# Patient Record
Sex: Female | Born: 1980 | Race: White | Hispanic: No | Smoking: Never smoker
Health system: Southern US, Community
[De-identification: ages and names within clinical notes are randomized; demographics above are authoritative.]

## PROBLEM LIST (undated history)

## (undated) DIAGNOSIS — G5600 Carpal tunnel syndrome, unspecified upper limb: Secondary | ICD-10-CM

## (undated) DIAGNOSIS — T7840XA Allergy, unspecified, initial encounter: Secondary | ICD-10-CM

## (undated) DIAGNOSIS — F419 Anxiety disorder, unspecified: Secondary | ICD-10-CM

## (undated) DIAGNOSIS — M1990 Unspecified osteoarthritis, unspecified site: Secondary | ICD-10-CM

## (undated) DIAGNOSIS — J189 Pneumonia, unspecified organism: Secondary | ICD-10-CM

## (undated) DIAGNOSIS — I10 Essential (primary) hypertension: Secondary | ICD-10-CM

## (undated) DIAGNOSIS — F329 Major depressive disorder, single episode, unspecified: Secondary | ICD-10-CM

## (undated) DIAGNOSIS — D649 Anemia, unspecified: Secondary | ICD-10-CM

## (undated) DIAGNOSIS — F32A Depression, unspecified: Secondary | ICD-10-CM

## (undated) DIAGNOSIS — M797 Fibromyalgia: Secondary | ICD-10-CM

## (undated) DIAGNOSIS — R519 Headache, unspecified: Secondary | ICD-10-CM

## (undated) DIAGNOSIS — T8859XA Other complications of anesthesia, initial encounter: Secondary | ICD-10-CM

## (undated) DIAGNOSIS — E039 Hypothyroidism, unspecified: Secondary | ICD-10-CM

## (undated) DIAGNOSIS — G8929 Other chronic pain: Secondary | ICD-10-CM

## (undated) HISTORY — DX: Depression, unspecified: F32.A

## (undated) HISTORY — DX: Carpal tunnel syndrome, unspecified upper limb: G56.00

## (undated) HISTORY — DX: Major depressive disorder, single episode, unspecified: F32.9

## (undated) HISTORY — DX: Allergy, unspecified, initial encounter: T78.40XA

## (undated) HISTORY — PX: CARPAL TUNNEL RELEASE: SHX101

## (undated) HISTORY — DX: Anxiety disorder, unspecified: F41.9

## (undated) HISTORY — DX: Essential (primary) hypertension: I10

---
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
## 2000-06-12 ENCOUNTER — Other Ambulatory Visit: Admission: RE | Admit: 2000-06-12 | Discharge: 2000-06-12 | Payer: Self-pay | Admitting: Obstetrics

## 2000-06-21 ENCOUNTER — Observation Stay (HOSPITAL_COMMUNITY): Admission: AD | Admit: 2000-06-21 | Discharge: 2000-06-21 | Payer: Self-pay | Admitting: Obstetrics

## 2000-09-11 ENCOUNTER — Inpatient Hospital Stay (HOSPITAL_COMMUNITY): Admission: AD | Admit: 2000-09-11 | Discharge: 2000-09-11 | Payer: Self-pay | Admitting: Obstetrics

## 2000-10-21 ENCOUNTER — Inpatient Hospital Stay (HOSPITAL_COMMUNITY): Admission: AD | Admit: 2000-10-21 | Discharge: 2000-10-21 | Payer: Self-pay | Admitting: Obstetrics

## 2000-10-27 ENCOUNTER — Inpatient Hospital Stay (HOSPITAL_COMMUNITY): Admission: AD | Admit: 2000-10-27 | Discharge: 2000-10-27 | Payer: Self-pay | Admitting: Obstetrics

## 2000-11-02 ENCOUNTER — Inpatient Hospital Stay (HOSPITAL_COMMUNITY): Admission: AD | Admit: 2000-11-02 | Discharge: 2000-11-02 | Payer: Self-pay | Admitting: Obstetrics

## 2000-12-29 ENCOUNTER — Inpatient Hospital Stay (HOSPITAL_COMMUNITY): Admission: AD | Admit: 2000-12-29 | Discharge: 2000-12-29 | Payer: Self-pay | Admitting: Obstetrics

## 2001-01-23 ENCOUNTER — Inpatient Hospital Stay (HOSPITAL_COMMUNITY): Admission: AD | Admit: 2001-01-23 | Discharge: 2001-01-23 | Payer: Self-pay | Admitting: Obstetrics

## 2001-01-30 ENCOUNTER — Inpatient Hospital Stay (HOSPITAL_COMMUNITY): Admission: AD | Admit: 2001-01-30 | Discharge: 2001-02-03 | Payer: Self-pay | Admitting: Obstetrics

## 2001-01-30 ENCOUNTER — Encounter: Payer: Self-pay | Admitting: Obstetrics

## 2001-01-30 ENCOUNTER — Encounter (INDEPENDENT_AMBULATORY_CARE_PROVIDER_SITE_OTHER): Payer: Self-pay

## 2001-02-04 ENCOUNTER — Encounter: Admission: RE | Admit: 2001-02-04 | Discharge: 2001-03-06 | Payer: Self-pay | Admitting: Obstetrics

## 2001-02-14 ENCOUNTER — Inpatient Hospital Stay (HOSPITAL_COMMUNITY): Admission: AD | Admit: 2001-02-14 | Discharge: 2001-02-14 | Payer: Self-pay | Admitting: Anesthesiology

## 2002-01-12 ENCOUNTER — Encounter: Admission: RE | Admit: 2002-01-12 | Discharge: 2002-01-12 | Payer: Self-pay | Admitting: *Deleted

## 2002-04-10 ENCOUNTER — Encounter: Admission: RE | Admit: 2002-04-10 | Discharge: 2002-04-10 | Payer: Self-pay | Admitting: *Deleted

## 2007-04-18 ENCOUNTER — Emergency Department (HOSPITAL_COMMUNITY): Admission: EM | Admit: 2007-04-18 | Discharge: 2007-04-18 | Payer: Self-pay | Admitting: Emergency Medicine

## 2007-07-01 ENCOUNTER — Emergency Department (HOSPITAL_COMMUNITY): Admission: EM | Admit: 2007-07-01 | Discharge: 2007-07-01 | Payer: Self-pay | Admitting: Emergency Medicine

## 2007-12-21 ENCOUNTER — Emergency Department (HOSPITAL_COMMUNITY): Admission: EM | Admit: 2007-12-21 | Discharge: 2007-12-22 | Payer: Self-pay | Admitting: Emergency Medicine

## 2008-07-26 ENCOUNTER — Emergency Department (HOSPITAL_COMMUNITY): Admission: EM | Admit: 2008-07-26 | Discharge: 2008-07-26 | Payer: Self-pay | Admitting: Emergency Medicine

## 2009-03-04 ENCOUNTER — Emergency Department (HOSPITAL_COMMUNITY): Admission: EM | Admit: 2009-03-04 | Discharge: 2009-03-05 | Payer: Self-pay | Admitting: Emergency Medicine

## 2009-03-04 ENCOUNTER — Encounter (INDEPENDENT_AMBULATORY_CARE_PROVIDER_SITE_OTHER): Payer: Self-pay | Admitting: *Deleted

## 2009-04-18 ENCOUNTER — Emergency Department (HOSPITAL_COMMUNITY): Admission: EM | Admit: 2009-04-18 | Discharge: 2009-04-18 | Payer: Self-pay | Admitting: Emergency Medicine

## 2009-04-26 DIAGNOSIS — F319 Bipolar disorder, unspecified: Secondary | ICD-10-CM | POA: Insufficient documentation

## 2009-04-26 DIAGNOSIS — T7840XA Allergy, unspecified, initial encounter: Secondary | ICD-10-CM | POA: Insufficient documentation

## 2009-04-26 DIAGNOSIS — K219 Gastro-esophageal reflux disease without esophagitis: Secondary | ICD-10-CM | POA: Insufficient documentation

## 2009-04-27 ENCOUNTER — Ambulatory Visit: Payer: Self-pay | Admitting: Gastroenterology

## 2009-04-27 DIAGNOSIS — R112 Nausea with vomiting, unspecified: Secondary | ICD-10-CM | POA: Insufficient documentation

## 2009-04-27 DIAGNOSIS — M549 Dorsalgia, unspecified: Secondary | ICD-10-CM | POA: Insufficient documentation

## 2009-04-27 DIAGNOSIS — Z87448 Personal history of other diseases of urinary system: Secondary | ICD-10-CM | POA: Insufficient documentation

## 2009-04-27 DIAGNOSIS — F329 Major depressive disorder, single episode, unspecified: Secondary | ICD-10-CM | POA: Insufficient documentation

## 2009-04-27 DIAGNOSIS — R195 Other fecal abnormalities: Secondary | ICD-10-CM | POA: Insufficient documentation

## 2009-04-27 DIAGNOSIS — R1011 Right upper quadrant pain: Secondary | ICD-10-CM | POA: Insufficient documentation

## 2009-04-27 DIAGNOSIS — C539 Malignant neoplasm of cervix uteri, unspecified: Secondary | ICD-10-CM | POA: Insufficient documentation

## 2009-04-27 DIAGNOSIS — F411 Generalized anxiety disorder: Secondary | ICD-10-CM | POA: Insufficient documentation

## 2009-04-27 LAB — CONVERTED CEMR LAB
Ferritin: 13.4 ng/mL (ref 10.0–291.0)
Hepatitis B Surface Ag: NEGATIVE
Sed Rate: 5 mm/hr (ref 0–22)
Vitamin B-12: 382 pg/mL (ref 211–911)

## 2009-04-28 ENCOUNTER — Telehealth: Payer: Self-pay | Admitting: Gastroenterology

## 2009-05-03 ENCOUNTER — Telehealth (INDEPENDENT_AMBULATORY_CARE_PROVIDER_SITE_OTHER): Payer: Self-pay | Admitting: *Deleted

## 2009-05-03 ENCOUNTER — Ambulatory Visit: Payer: Self-pay | Admitting: Gastroenterology

## 2009-05-03 ENCOUNTER — Encounter: Payer: Self-pay | Admitting: Gastroenterology

## 2009-05-03 DIAGNOSIS — K2970 Gastritis, unspecified, without bleeding: Secondary | ICD-10-CM | POA: Insufficient documentation

## 2009-05-03 DIAGNOSIS — K2990 Gastroduodenitis, unspecified, without bleeding: Secondary | ICD-10-CM

## 2009-05-05 ENCOUNTER — Encounter: Payer: Self-pay | Admitting: Gastroenterology

## 2010-08-05 DIAGNOSIS — J189 Pneumonia, unspecified organism: Secondary | ICD-10-CM

## 2010-08-05 HISTORY — DX: Pneumonia, unspecified organism: J18.9

## 2010-11-09 LAB — CBC
Platelets: 283 10*3/uL (ref 150–400)
RBC: 4.27 MIL/uL (ref 3.87–5.11)
WBC: 8.1 10*3/uL (ref 4.0–10.5)

## 2010-11-09 LAB — URINALYSIS, ROUTINE W REFLEX MICROSCOPIC
Bilirubin Urine: NEGATIVE
Glucose, UA: NEGATIVE mg/dL
Hgb urine dipstick: NEGATIVE
Specific Gravity, Urine: 1.023 (ref 1.005–1.030)
Urobilinogen, UA: 0.2 mg/dL (ref 0.0–1.0)

## 2010-11-09 LAB — LIPASE, BLOOD: Lipase: 22 U/L (ref 11–59)

## 2010-11-09 LAB — DIFFERENTIAL
Eosinophils Absolute: 0.3 10*3/uL (ref 0.0–0.7)
Eosinophils Relative: 4 % (ref 0–5)
Lymphs Abs: 2.3 10*3/uL (ref 0.7–4.0)
Monocytes Absolute: 0.4 10*3/uL (ref 0.1–1.0)

## 2010-11-09 LAB — COMPREHENSIVE METABOLIC PANEL
ALT: 18 U/L (ref 0–35)
AST: 22 U/L (ref 0–37)
Albumin: 4 g/dL (ref 3.5–5.2)
CO2: 24 mEq/L (ref 19–32)
Calcium: 8.9 mg/dL (ref 8.4–10.5)
Chloride: 107 mEq/L (ref 96–112)
GFR calc Af Amer: >60 mL/min (ref >60)
GFR calc non Af Amer: >60 mL/min (ref >60)
Sodium: 138 mEq/L (ref 135–145)
Total Bilirubin: 0.6 mg/dL (ref 0.3–1.2)

## 2010-11-11 LAB — URINE MICROSCOPIC-ADD ON

## 2010-11-11 LAB — URINALYSIS, ROUTINE W REFLEX MICROSCOPIC
Glucose, UA: NEGATIVE mg/dL
Hgb urine dipstick: NEGATIVE
Protein, ur: NEGATIVE mg/dL
Specific Gravity, Urine: 1.034 — ABNORMAL HIGH (ref 1.005–1.030)
pH: 6.5 (ref 5.0–8.0)

## 2010-11-11 LAB — COMPREHENSIVE METABOLIC PANEL
ALT: 13 U/L (ref 0–35)
Albumin: 3.8 g/dL (ref 3.5–5.2)
Alkaline Phosphatase: 56 U/L (ref 39–117)
Calcium: 8.8 mg/dL (ref 8.4–10.5)
GFR calc Af Amer: >60 mL/min (ref >60)
Glucose, Bld: 92 mg/dL (ref 70–99)
Potassium: 3.6 mEq/L (ref 3.5–5.1)
Sodium: 138 mEq/L (ref 135–145)
Total Protein: 6.4 g/dL (ref 6.0–8.3)

## 2010-11-11 LAB — CBC
MCHC: 33.2 g/dL (ref 30.0–36.0)
Platelets: 272 10*3/uL (ref 150–400)
RDW: 15.3 % (ref 11.5–15.5)

## 2010-11-11 LAB — DIFFERENTIAL
Basophils Relative: 1 % (ref 0–1)
Eosinophils Absolute: 0.5 10*3/uL (ref 0.0–0.7)
Lymphs Abs: 1.8 10*3/uL (ref 0.7–4.0)
Monocytes Absolute: 0.6 10*3/uL (ref 0.1–1.0)
Monocytes Relative: 6 % (ref 3–12)
Neutro Abs: 6.7 10*3/uL (ref 1.7–7.7)
Neutrophils Relative %: 69 % (ref 43–77)

## 2010-11-11 LAB — POCT PREGNANCY, URINE: Preg Test, Ur: NEGATIVE

## 2010-12-21 NOTE — Discharge Summary (Signed)
[facility]  Patient:    Kathy Hickman, Kathy Hickman                     MRN: [id number] Adm. Date:  [id number] Attending:  Venita Sheffield                           Discharge Summary  HISTORY:                      The patient is a 30 year old primigravida with an [facility] of January 25, 2001 who was brought in for induction at the patients request. Her cervix was closed. The presenting part was floating. She had a negative group B strep. Ultrasound was performed for presentation and it was a vertex and oligohydramnios was noted.  HOSPITAL COURSE:              Cervidil was inserted and the patient was started on Pitocin stimulation. At the time she was started the vertex was at -3 station, membranes were artificially ruptured, fluid was meconium stained. IUPC and scalp lead was started and she had an amnioinfusion. After being in active labor for greater than eight hours, it was decided she would be delivered by C-section because of failure to progress in labor. She delivered a female, Apgars 7 and 9 from the OP position weighing 7 pounds 3 ounces. Postoperatively, she did well. Her hemoglobin was 9.9. She was discharged home on the third postoperative day, ambulatory on a regular diet.  DISCHARGE MEDICATIONS:        1. Tylox one q.4h. p.r.n.                               2. Ferrous sulfate 325 mg p.o. q.d.                               3. Micronor one p.o. daily.  DISCHARGE FOLLOWUP:           The patient is to see me in six weeks.  DISCHARGE DIAGNOSIS:          Status post primary low transverse cesarean section at term with cephalopelvic disproportion. DD:  02/03/01 TD:  02/03/01 Job: [id number]

## 2010-12-21 NOTE — Op Note (Signed)
[facility]  Patient:    Kathy Hickman, Kathy Hickman                       MRN: [id number] Proc. Date: 01/31/01 Attending:  Kathreen Cosier, M.D.                           Operative Report  PREOPERATIVE DIAGNOSES:       1. Failure to progress.                               2. Cephalopelvic disproportion.  SURGEON:                      Kathreen Cosier, M.D.  ANESTHESIA:                   Epidural.  DESCRIPTION OF PROCEDURE:     The patient was placed on the operating room table in the supine position.  The abdomen was prepped and draped.  The bladder was emptied with a Foley catheter.  A transverse suprapubic incision was made and carried down to the rectus fascia.  The fascia was cleanly incised for the length of the incision.  The rectus muscles were retracted laterally.  The peritoneum was incised longitudinally.  A transverse incision was made in the visceral peritoneum above the bladder and the bladder mobilized inferiorly.  A transverse lower uterine incision was made.  The patient was delivered from the OP position of a female with Apgars of 7 and 9 weighing 7 lb 3 oz.  The placenta was anterior, small, removed manually and sent to pathology.  The uterine cavity was cleaned with dry laps.  The uterine incision was closed with continuous suture of #1 chromic.  Hemostasis was satisfactory.  The bladder flap was reattached with 2-0 chromic.  The uterus was well contracted.  The tubes and ovaries were normal.  The abdomen was closed in layers, the peritoneum with continuous suture of 0 chromic, the fascia with continuous suture of 0 Dexon, and the skin closed with subcuticular stitch of 3-0 plain.  Blood loss was 600 cc.  The patient tolerated the procedure well and was taken to the recovery room in good condition. DD:  01/31/01 TD:  01/31/01 Job: [id number]

## 2010-12-21 NOTE — H&P (Signed)
[facility]  Patient:    Kathy Hickman, Kathy Hickman                       MRN: [id number] Adm. Date:  01/30/01 Attending:  Kathreen Cosier, M.D.                         History and Physical  HISTORY:                      The patient is a 30 year old primigravida with an [facility] of January 25, 2001.  She was admitted for induction at the patients request.  The cervix was closed.  The vertex was floating.  She had a negative group B Strep.  On admission, ultrasound was performed which showed oligohydramnios.  The infant was ______ vertex presentation.  Cervidil was inserted and she was started on Pitocin at approximately 5:00 p.m. in the afternoon.  She had been contracting on her own.  The cervix was 3 cm with the vertex -3.  Membranes were artificially ruptured.  The fluid was meconium stained and there was a small amount of fluid.  IUPC and scalp ______ were planted.  Amnio infusion was begun.  The patient was also on Pitocin stimulation.  She progressed to 3-4 cm, 90%, with the vertex at -3, good labor and remained unchanged.  It was decided that she would deliver by C-section because of failure to progress.  PHYSICAL EXAMINATION:  HEENT:                        Negative.  LUNGS:                        Clear.  HEART:                        Regular rhythm.  No murmurs or gallops.  ABDOMEN:                      Term-sized uterus.  Estimated fetal weight was 7 lb.  EXTREMITIES:                  Negative. DD:  01/31/01 TD:  01/31/01 Job: [id number]

## 2011-03-17 ENCOUNTER — Emergency Department (HOSPITAL_COMMUNITY)
Admission: EM | Admit: 2011-03-17 | Discharge: 2011-03-17 | Disposition: A | Discharge status: Home / Self Care | Payer: BC Managed Care – PPO | Attending: Emergency Medicine | Admitting: Emergency Medicine

## 2011-03-17 DIAGNOSIS — R5381 Other malaise: Secondary | ICD-10-CM | POA: Insufficient documentation

## 2011-03-17 DIAGNOSIS — F319 Bipolar disorder, unspecified: Secondary | ICD-10-CM | POA: Insufficient documentation

## 2011-03-17 DIAGNOSIS — R42 Dizziness and giddiness: Secondary | ICD-10-CM | POA: Insufficient documentation

## 2011-03-17 DIAGNOSIS — Z79899 Other long term (current) drug therapy: Secondary | ICD-10-CM | POA: Insufficient documentation

## 2011-03-17 LAB — COMPREHENSIVE METABOLIC PANEL
ALT: 16 U/L (ref 0–35)
AST: 15 U/L (ref 0–37)
Albumin: 3.9 g/dL (ref 3.5–5.2)
Calcium: 9.6 mg/dL (ref 8.4–10.5)
GFR calc Af Amer: >60 mL/min (ref >60)
Glucose, Bld: 101 mg/dL — ABNORMAL HIGH (ref 70–99)
Sodium: 136 mEq/L (ref 135–145)
Total Protein: 7.2 g/dL (ref 6.0–8.3)

## 2011-03-17 LAB — RAPID URINE DRUG SCREEN, HOSP PERFORMED
Amphetamines: NOT DETECTED
Barbiturates: NOT DETECTED
Benzodiazepines: POSITIVE — AB
Tetrahydrocannabinol: NOT DETECTED

## 2011-03-17 LAB — CBC
Hemoglobin: 12.2 g/dL (ref 12.0–15.0)
MCH: 28.2 pg (ref 26.0–34.0)
MCHC: 33.2 g/dL (ref 30.0–36.0)
MCV: 84.8 fL (ref 78.0–100.0)
RBC: 4.33 MIL/uL (ref 3.87–5.11)

## 2011-03-17 LAB — URINALYSIS, ROUTINE W REFLEX MICROSCOPIC
Bilirubin Urine: NEGATIVE
Glucose, UA: NEGATIVE mg/dL
Hgb urine dipstick: NEGATIVE
Ketones, ur: NEGATIVE mg/dL
Protein, ur: NEGATIVE mg/dL
pH: 6.5 (ref 5.0–8.0)

## 2011-03-17 LAB — DIFFERENTIAL
Lymphs Abs: 1.9 10*3/uL (ref 0.7–4.0)
Monocytes Absolute: 0.5 10*3/uL (ref 0.1–1.0)
Monocytes Relative: 7 % (ref 3–12)
Neutro Abs: 4.6 10*3/uL (ref 1.7–7.7)
Neutrophils Relative %: 63 % (ref 43–77)

## 2011-05-10 LAB — BASIC METABOLIC PANEL
CO2: 24 mEq/L (ref 19–32)
Calcium: 8.8 mg/dL (ref 8.4–10.5)
Creatinine, Ser: 0.86 mg/dL (ref 0.4–1.2)
GFR calc Af Amer: >60 mL/min (ref >60)

## 2011-05-10 LAB — URINE MICROSCOPIC-ADD ON

## 2011-05-10 LAB — URINALYSIS, ROUTINE W REFLEX MICROSCOPIC
Bilirubin Urine: NEGATIVE
Glucose, UA: NEGATIVE mg/dL
Nitrite: NEGATIVE
Specific Gravity, Urine: 1.02 (ref 1.005–1.030)
pH: 5.5 (ref 5.0–8.0)

## 2011-05-10 LAB — CBC
MCHC: 33 g/dL (ref 30.0–36.0)
RBC: 4.56 MIL/uL (ref 3.87–5.11)
WBC: 12.9 10*3/uL — ABNORMAL HIGH (ref 4.0–10.5)

## 2011-05-10 LAB — POCT PREGNANCY, URINE: Preg Test, Ur: NEGATIVE

## 2011-05-14 LAB — CBC
Hemoglobin: 12.8
MCHC: 34.2
RBC: 4.53

## 2011-05-14 LAB — BASIC METABOLIC PANEL
CO2: 26
Calcium: 9.2
GFR calc Af Amer: >60
GFR calc non Af Amer: >60
Sodium: 140

## 2011-05-14 LAB — DIFFERENTIAL
Lymphocytes Relative: 16
Lymphs Abs: 1.1
Monocytes Absolute: 0.4
Monocytes Relative: 6
Neutro Abs: 5.5

## 2011-05-14 LAB — URINALYSIS, ROUTINE W REFLEX MICROSCOPIC
Nitrite: NEGATIVE
Specific Gravity, Urine: 1.009
pH: 6.5

## 2011-05-14 LAB — GC/CHLAMYDIA PROBE AMP, GENITAL
Chlamydia, DNA Probe: NEGATIVE
GC Probe Amp, Genital: NEGATIVE

## 2011-05-14 LAB — WET PREP, GENITAL
Clue Cells Wet Prep HPF POC: NONE SEEN
Trich, Wet Prep: NONE SEEN
Yeast Wet Prep HPF POC: NONE SEEN

## 2011-05-14 LAB — PREGNANCY, URINE: Preg Test, Ur: POSITIVE

## 2011-05-17 LAB — URINE CULTURE
Colony Count: NO GROWTH
Culture: NO GROWTH

## 2011-05-17 LAB — URINALYSIS, ROUTINE W REFLEX MICROSCOPIC
Bilirubin Urine: NEGATIVE
Glucose, UA: NEGATIVE
Ketones, ur: NEGATIVE
Specific Gravity, Urine: 1.022
pH: 6.5

## 2011-05-17 LAB — URINE MICROSCOPIC-ADD ON

## 2011-07-14 ENCOUNTER — Encounter: Payer: Self-pay | Admitting: *Deleted

## 2011-07-14 ENCOUNTER — Inpatient Hospital Stay (HOSPITAL_COMMUNITY)
Admission: EM | Admit: 2011-07-14 | Discharge: 2011-07-15 | DRG: 089 | Disposition: A | Discharge status: Home / Self Care | Payer: BC Managed Care – PPO | Attending: Internal Medicine | Admitting: Internal Medicine

## 2011-07-14 ENCOUNTER — Emergency Department (HOSPITAL_COMMUNITY): Payer: BC Managed Care – PPO

## 2011-07-14 DIAGNOSIS — J09X2 Influenza due to identified novel influenza A virus with other respiratory manifestations: Principal | ICD-10-CM | POA: Diagnosis present

## 2011-07-14 DIAGNOSIS — R0902 Hypoxemia: Secondary | ICD-10-CM | POA: Diagnosis present

## 2011-07-14 DIAGNOSIS — J40 Bronchitis, not specified as acute or chronic: Secondary | ICD-10-CM | POA: Diagnosis present

## 2011-07-14 DIAGNOSIS — D509 Iron deficiency anemia, unspecified: Secondary | ICD-10-CM | POA: Diagnosis present

## 2011-07-14 DIAGNOSIS — D500 Iron deficiency anemia secondary to blood loss (chronic): Secondary | ICD-10-CM | POA: Diagnosis present

## 2011-07-14 DIAGNOSIS — J45909 Unspecified asthma, uncomplicated: Secondary | ICD-10-CM | POA: Diagnosis present

## 2011-07-14 DIAGNOSIS — IMO0001 Reserved for inherently not codable concepts without codable children: Secondary | ICD-10-CM | POA: Diagnosis present

## 2011-07-14 DIAGNOSIS — F319 Bipolar disorder, unspecified: Secondary | ICD-10-CM

## 2011-07-14 DIAGNOSIS — F411 Generalized anxiety disorder: Secondary | ICD-10-CM | POA: Diagnosis present

## 2011-07-14 DIAGNOSIS — J189 Pneumonia, unspecified organism: Secondary | ICD-10-CM

## 2011-07-14 DIAGNOSIS — K219 Gastro-esophageal reflux disease without esophagitis: Secondary | ICD-10-CM

## 2011-07-14 DIAGNOSIS — J101 Influenza due to other identified influenza virus with other respiratory manifestations: Secondary | ICD-10-CM | POA: Diagnosis present

## 2011-07-14 HISTORY — DX: Unspecified osteoarthritis, unspecified site: M19.90

## 2011-07-14 HISTORY — DX: Fibromyalgia: M79.7

## 2011-07-14 HISTORY — DX: Pneumonia, unspecified organism: J18.9

## 2011-07-14 LAB — CBC
HCT: 31.2 % — ABNORMAL LOW (ref 36.0–46.0)
Hemoglobin: 10.3 g/dL — ABNORMAL LOW (ref 12.0–15.0)
MCH: 27.8 pg (ref 26.0–34.0)
MCHC: 33 g/dL (ref 30.0–36.0)
MCV: 84.1 fL (ref 78.0–100.0)
Platelets: 199 10*3/uL (ref 150–400)
RBC: 3.71 MIL/uL — ABNORMAL LOW (ref 3.87–5.11)
RDW: 13.7 % (ref 11.5–15.5)
WBC: 10.5 K/uL (ref 4.0–10.5)

## 2011-07-14 LAB — BASIC METABOLIC PANEL WITH GFR
BUN: 7 mg/dL (ref 6–23)
Chloride: 105 meq/L (ref 96–112)
GFR calc Af Amer: >90 mL/min (ref >90)
Glucose, Bld: 99 mg/dL (ref 70–99)
Potassium: 3.5 meq/L (ref 3.5–5.1)

## 2011-07-14 LAB — IRON AND TIBC
Iron: <10 ug/dL — ABNORMAL LOW (ref 42–135)
UIBC: 338 ug/dL (ref 125–400)

## 2011-07-14 LAB — DIFFERENTIAL
Basophils Absolute: 0 10*3/uL (ref 0.0–0.1)
Basophils Relative: 0 % (ref 0–1)
Eosinophils Absolute: 0.2 10*3/uL (ref 0.0–0.7)
Eosinophils Relative: 2 % (ref 0–5)
Lymphocytes Relative: 8 % — ABNORMAL LOW (ref 12–46)
Lymphs Abs: 0.8 K/uL (ref 0.7–4.0)
Monocytes Absolute: 0.8 10*3/uL (ref 0.1–1.0)
Monocytes Relative: 8 % (ref 3–12)
Neutro Abs: 8.6 K/uL — ABNORMAL HIGH (ref 1.7–7.7)
Neutrophils Relative %: 82 % — ABNORMAL HIGH (ref 43–77)

## 2011-07-14 LAB — BASIC METABOLIC PANEL
CO2: 25 mEq/L (ref 19–32)
Calcium: 8.9 mg/dL (ref 8.4–10.5)
Creatinine, Ser: 0.66 mg/dL (ref 0.50–1.10)
GFR calc non Af Amer: >90 mL/min (ref >90)
Sodium: 138 mEq/L (ref 135–145)

## 2011-07-14 LAB — EXPECTORATED SPUTUM ASSESSMENT W GRAM STAIN, RFLX TO RESP C

## 2011-07-14 LAB — PREGNANCY, URINE: Preg Test, Ur: NEGATIVE

## 2011-07-14 LAB — FERRITIN: Ferritin: 116 ng/mL (ref 10–291)

## 2011-07-14 LAB — FOLATE: Folate: >20 ng/mL

## 2011-07-14 MED ORDER — SUMATRIPTAN SUCCINATE 100 MG PO TABS
100.0000 mg | ORAL_TABLET | ORAL | Status: DC | PRN
  Administered 2011-07-14 – 2011-07-15 (×3): 100 mg via ORAL
  Filled 2011-07-14 (×3): qty 1

## 2011-07-14 MED ORDER — SUMATRIPTAN SUCCINATE 6 MG/0.5ML ~~LOC~~ SOLN
6.0000 mg | Freq: Once | SUBCUTANEOUS | Status: AC
  Administered 2011-07-14: 6 mg via SUBCUTANEOUS
  Filled 2011-07-14: qty 0.5

## 2011-07-14 MED ORDER — ALBUTEROL SULFATE (5 MG/ML) 0.5% IN NEBU: 2.5000 mg | INHALATION_SOLUTION | RESPIRATORY_TRACT | Status: DC | PRN

## 2011-07-14 MED ORDER — ALBUTEROL SULFATE HFA 108 (90 BASE) MCG/ACT IN AERS
2.0000 | INHALATION_SPRAY | Freq: Once | RESPIRATORY_TRACT | Status: AC
  Administered 2011-07-14: 2 via RESPIRATORY_TRACT
  Filled 2011-07-14: qty 6.7

## 2011-07-14 MED ORDER — DM-GUAIFENESIN ER 30-600 MG PO TB12
1.0000 | ORAL_TABLET | Freq: Two times a day (BID) | ORAL | Status: DC
  Administered 2011-07-14 – 2011-07-15 (×2): 1 via ORAL
  Filled 2011-07-14 (×5): qty 1

## 2011-07-14 MED ORDER — HYDROCODONE-ACETAMINOPHEN 5-325 MG PO TABS
2.0000 | ORAL_TABLET | Freq: Once | ORAL | Status: AC
  Administered 2011-07-14: 2 via ORAL
  Filled 2011-07-14: qty 2

## 2011-07-14 MED ORDER — METHYLPREDNISOLONE SODIUM SUCC 125 MG IJ SOLR
125.0000 mg | Freq: Once | INTRAMUSCULAR | Status: AC
  Administered 2011-07-14: 125 mg via INTRAVENOUS
  Filled 2011-07-14: qty 2

## 2011-07-14 MED ORDER — FLUTICASONE PROPIONATE HFA 44 MCG/ACT IN AERO
1.0000 | INHALATION_SPRAY | Freq: Two times a day (BID) | RESPIRATORY_TRACT | Status: DC
  Administered 2011-07-14 – 2011-07-15 (×2): 1 via RESPIRATORY_TRACT
  Filled 2011-07-14 (×2): qty 10.6

## 2011-07-14 MED ORDER — LORAZEPAM 1 MG PO TABS
1.0000 mg | ORAL_TABLET | Freq: Once | ORAL | Status: AC
  Administered 2011-07-14: 1 mg via ORAL
  Filled 2011-07-14: qty 1

## 2011-07-14 MED ORDER — MOXIFLOXACIN HCL 400 MG PO TABS
400.0000 mg | ORAL_TABLET | Freq: Once | ORAL | Status: AC
Start: 2011-07-14 — End: 2011-07-14
  Administered 2011-07-14: 400 mg via ORAL
  Filled 2011-07-14: qty 1

## 2011-07-14 MED ORDER — ONDANSETRON HCL 4 MG PO TABS: 4.0000 mg | ORAL_TABLET | Freq: Four times a day (QID) | ORAL | Status: DC | PRN

## 2011-07-14 MED ORDER — FLUOXETINE HCL 20 MG PO CAPS
40.0000 mg | ORAL_CAPSULE | Freq: Every day | ORAL | Status: DC
  Administered 2011-07-14 – 2011-07-15 (×2): 40 mg via ORAL
  Filled 2011-07-14 (×4): qty 2

## 2011-07-14 MED ORDER — ALPRAZOLAM 1 MG PO TABS
1.0000 mg | ORAL_TABLET | Freq: Three times a day (TID) | ORAL | Status: DC | PRN
  Administered 2011-07-14 – 2011-07-15 (×4): 1 mg via ORAL
  Filled 2011-07-14: qty 2
  Filled 2011-07-14 (×3): qty 1

## 2011-07-14 MED ORDER — ALBUTEROL SULFATE HFA 108 (90 BASE) MCG/ACT IN AERS
2.0000 | INHALATION_SPRAY | Freq: Four times a day (QID) | RESPIRATORY_TRACT | Status: DC | PRN
  Filled 2011-07-14: qty 6.7

## 2011-07-14 MED ORDER — BENZONATATE 100 MG PO CAPS
100.0000 mg | ORAL_CAPSULE | Freq: Three times a day (TID) | ORAL | Status: DC | PRN
  Administered 2011-07-14: 100 mg via ORAL
  Filled 2011-07-14 (×2): qty 1

## 2011-07-14 MED ORDER — ALBUTEROL SULFATE (5 MG/ML) 0.5% IN NEBU
2.5000 mg | INHALATION_SOLUTION | Freq: Four times a day (QID) | RESPIRATORY_TRACT | Status: DC
  Administered 2011-07-14 – 2011-07-15 (×4): 2.5 mg via RESPIRATORY_TRACT
  Filled 2011-07-14 (×3): qty 0.5

## 2011-07-14 MED ORDER — HYDROCODONE-ACETAMINOPHEN 10-325 MG PO TABS
1.0000 | ORAL_TABLET | Freq: Four times a day (QID) | ORAL | Status: DC | PRN
  Administered 2011-07-14 – 2011-07-15 (×4): 1 via ORAL
  Filled 2011-07-14 (×4): qty 1

## 2011-07-14 MED ORDER — METHYLPREDNISOLONE SODIUM SUCC 40 MG IJ SOLR
40.0000 mg | Freq: Four times a day (QID) | INTRAMUSCULAR | Status: DC
  Administered 2011-07-14 – 2011-07-15 (×3): 40 mg via INTRAVENOUS
  Filled 2011-07-14 (×9): qty 1

## 2011-07-14 MED ORDER — ACETAMINOPHEN 650 MG RE SUPP: 650.0000 mg | Freq: Four times a day (QID) | RECTAL | Status: DC | PRN

## 2011-07-14 MED ORDER — MOXIFLOXACIN HCL IN NACL 400 MG/250ML IV SOLN
400.0000 mg | INTRAVENOUS | Status: DC
  Filled 2011-07-14: qty 250

## 2011-07-14 MED ORDER — DOCUSATE SODIUM 100 MG PO CAPS
100.0000 mg | ORAL_CAPSULE | Freq: Two times a day (BID) | ORAL | Status: DC
  Administered 2011-07-14 – 2011-07-15 (×2): 100 mg via ORAL
  Filled 2011-07-14 (×4): qty 1

## 2011-07-14 MED ORDER — THERA M PLUS PO TABS
1.0000 | ORAL_TABLET | Freq: Every day | ORAL | Status: DC
  Administered 2011-07-15: 1 via ORAL
  Filled 2011-07-14 (×4): qty 1

## 2011-07-14 MED ORDER — IPRATROPIUM BROMIDE 0.02 % IN SOLN
0.5000 mg | Freq: Once | RESPIRATORY_TRACT | Status: AC
  Administered 2011-07-14: 0.5 mg via RESPIRATORY_TRACT
  Filled 2011-07-14: qty 2.5

## 2011-07-14 MED ORDER — ONDANSETRON HCL 4 MG/2ML IJ SOLN: 4.0000 mg | Freq: Four times a day (QID) | INTRAMUSCULAR | Status: DC | PRN

## 2011-07-14 MED ORDER — ALBUTEROL SULFATE (5 MG/ML) 0.5% IN NEBU
5.0000 mg | INHALATION_SOLUTION | Freq: Once | RESPIRATORY_TRACT | Status: AC
  Administered 2011-07-14: 5 mg via RESPIRATORY_TRACT
  Filled 2011-07-14: qty 1

## 2011-07-14 MED ORDER — CALCIUM CARBONATE-VITAMIN D 500-200 MG-UNIT PO TABS
1.0000 | ORAL_TABLET | Freq: Every day | ORAL | Status: DC
  Administered 2011-07-14 – 2011-07-15 (×2): 1 via ORAL
  Filled 2011-07-14 (×4): qty 1

## 2011-07-14 MED ORDER — TIZANIDINE HCL 2 MG PO TABS
4.0000 mg | ORAL_TABLET | Freq: Three times a day (TID) | ORAL | Status: DC | PRN
  Administered 2011-07-14 – 2011-07-15 (×3): 4 mg via ORAL
  Filled 2011-07-14 (×3): qty 2

## 2011-07-14 MED ORDER — ACETAMINOPHEN 325 MG PO TABS: 650.0000 mg | ORAL_TABLET | Freq: Four times a day (QID) | ORAL | Status: DC | PRN

## 2011-07-14 MED ORDER — POLYETHYLENE GLYCOL 3350 17 G PO PACK
17.0000 g | PACK | Freq: Every day | ORAL | Status: DC
  Administered 2011-07-15: 17 g via ORAL
  Filled 2011-07-14 (×3): qty 1

## 2011-07-14 MED ORDER — SODIUM CHLORIDE 0.9 % IV SOLN
INTRAVENOUS | Status: DC
  Administered 2011-07-14 (×2): via INTRAVENOUS

## 2011-07-14 MED ORDER — ALBUTEROL SULFATE (5 MG/ML) 0.5% IN NEBU
2.5000 mg | INHALATION_SOLUTION | Freq: Once | RESPIRATORY_TRACT | Status: AC
  Administered 2011-07-14: 5 mg via RESPIRATORY_TRACT
  Filled 2011-07-14: qty 1

## 2011-07-14 MED ORDER — GUAIFENESIN-DM 100-10 MG/5ML PO SYRP
5.0000 mL | ORAL_SOLUTION | ORAL | Status: DC | PRN
  Filled 2011-07-14: qty 10

## 2011-07-14 NOTE — ED Notes (Signed)
Pt states she has experienced shortness of breath and coughing x1 week.  Pt saw her GP on this past Thursday and was prescribed azithromycin and tessionex.  Pt was diagnosed by her gp at that point as having "early pneumonia and bronchitis".  Pt presents tonight win increased shortness of breath and with a cough that produces red/yellow phlegm.  Pt satting 91% on 2L/[place].

## 2011-07-14 NOTE — ED Provider Notes (Signed)
History     CSN: [id number] Arrival date & time: 07/14/2011  6:38 AM   First MD Initiated Contact with Patient 07/14/11 [phone]      Chief Complaint  Patient presents with  . Shortness of Breath    (Consider location/radiation/quality/duration/timing/severity/associated sxs/prior treatment) HPI Comments: Pt is brought by EMS due to SOB and CP worse with coughing that was worse this AM.  She had sinus problems beginning about 1-2 weeks ago and coughing that is productive worse over the past week.  She has a sig h/o asthma as a child, but no longer uses inhalers.  She gets abx' for cough and sinus infections frequently by her PCP.  She doesn't think she has had fevers or chills.  She thinks due to CP she has pneumonia.  She doesn't smoke.  She has a h/o fibromyalgia, chronic low back pain, allergies.  She saw PCP on Thursday and received tessalon perles and Z pak.  She has been taking but not getting much better.  She reports on the ambulance, her oxygen levels were low and she feels improved on O2 currently.    Patient is a 30 y.o. female presenting with shortness of breath. The history is provided by the patient.  Shortness of Breath  Associated symptoms include chest pain, rhinorrhea, cough and shortness of breath. Pertinent negatives include no fever.    Past Medical History  Diagnosis Date  . Pneumonia   . Asthma   . Fibromyalgia   . Arthritis     History reviewed. No pertinent past surgical history.  History reviewed. No pertinent family history.  History  Substance Use Topics  . Smoking status: Not on file  . Smokeless tobacco: Not on file  . Alcohol Use:     OB History    Grav Para Term Preterm Abortions TAB SAB Ect Mult Living                  Review of Systems  Constitutional: Positive for fatigue. Negative for fever and chills.  HENT: Positive for congestion, rhinorrhea, sneezing and sinus pressure.   Respiratory: Positive for cough and shortness of breath.     Cardiovascular: Positive for chest pain.  Gastrointestinal: Positive for constipation. Negative for nausea, vomiting and diarrhea.  Genitourinary: Negative for dysuria and flank pain.  Musculoskeletal: Positive for back pain.  Neurological: Positive for headaches.  Psychiatric/Behavioral: The patient is nervous/anxious.   All other systems reviewed and are negative.    Allergies  Amoxicillin; Sulfonamide derivatives; and Latex  Home Medications   Current Outpatient Rx  Name Route Sig Dispense Refill  . ALBUTEROL SULFATE HFA 108 (90 BASE) MCG/ACT IN AERS Inhalation Inhale 2 puffs into the lungs every 6 (six) hours as needed. Shortness of breath     . ALPRAZOLAM 1 MG PO TABS Oral Take 1.5 mg by mouth 3 (three) times daily as needed. anxiety    . AZITHROMYCIN 250 MG PO TABS Oral Take 250 mg by mouth daily. zpak-on day 4     . BECLOMETHASONE DIPROPIONATE 80 MCG/ACT IN AERS Inhalation Inhale 1 puff into the lungs as needed. Asthma-cough     . BENZONATATE 100 MG PO CAPS Oral Take 100 mg by mouth 3 (three) times daily as needed.      Marland Kitchen CALCIUM CARBONATE-VITAMIN D 500-200 MG-UNIT PO TABS Oral Take 1 tablet by mouth daily.      Marland Kitchen CETIRIZINE-PSEUDOEPHEDRINE ER 5-120 MG PO TB12 Oral Take 1 tablet by mouth 2 (two) times daily.  allergies     . DM-GUAIFENESIN ER 30-600 MG PO TB12 Oral Take 1 tablet by mouth every 12 (twelve) hours.      . DOCUSATE SODIUM 100 MG PO CAPS Oral Take 100 mg by mouth 2 (two) times daily.      Marland Kitchen FLUOXETINE HCL 10 MG PO CAPS Oral Take 40 mg by mouth daily.     Marland Kitchen HYDROCODONE-ACETAMINOPHEN 10-325 MG PO TABS Oral Take 1 tablet by mouth every 6 (six) hours as needed.      Marland Kitchen MECLIZINE HCL 25 MG PO TABS Oral Take 25 mg by mouth 3 (three) times daily as needed. nausea     . THERA M PLUS PO TABS Oral Take 1 tablet by mouth daily.      Marland Kitchen NAPROXEN SODIUM 550 MG PO TABS Oral Take 550 mg by mouth 2 (two) times daily with a meal.      . POLYETHYLENE GLYCOL 3350 PO PACK Oral Take 17 g  by mouth daily.      . SUMATRIPTAN SUCCINATE 100 MG PO TABS Oral Take 100 mg by mouth every 2 (two) hours as needed.      Marland Kitchen TIZANIDINE HCL 4 MG PO CAPS Oral Take 4 mg by mouth 3 (three) times daily.        BP 145/101  Pulse 82  Temp(Src) 99.7 F (37.6 C) (Oral)  Resp 18  SpO2 92%  LMP 07/14/2011  Physical Exam  Nursing note and vitals reviewed. Constitutional: She is oriented to person, place, and time. She appears well-developed and well-nourished.  HENT:  Head: Normocephalic and atraumatic.  Nose: Mucosal edema present. Right sinus exhibits maxillary sinus tenderness and frontal sinus tenderness. Left sinus exhibits maxillary sinus tenderness and frontal sinus tenderness.  Eyes: Pupils are equal, round, and reactive to light. No scleral icterus.  Neck: Normal range of motion. Neck supple.  Cardiovascular: Normal rate.   Pulmonary/Chest: Accessory muscle usage present. Tachypnea noted. She has wheezes. She has rhonchi. She has no rales.       Productive cough of yellow, green blood tinged sputum.  Abdominal: Soft. There is no tenderness. There is no rebound and no guarding.  Musculoskeletal: Normal range of motion. She exhibits no edema and no tenderness.  Lymphadenopathy:    She has no cervical adenopathy.  Neurological: She is alert and oriented to person, place, and time.  Skin: No rash noted.  Psychiatric: She has a normal mood and affect.    ED Course  Procedures (including critical care time)   Labs Reviewed  CULTURE, SPUTUM-ASSESSMENT  CULTURE, RESPIRATORY   No results found.   No diagnosis found.    MDM    Patient with remote history of asthma as a child. She does not smoke and has not required inhalers or nebulizer treatments in many years. She does take Flexeril, Xanax, hydrocodone for low back pain as well as fibromyalgia and anxiety. She's had a productive cough along with sinus problems for one to 2 weeks. Her saturations on 2 L here was 91% after  arrival which is low. She was given a nebulizer treatment upon arrival prior to my evaluation and her saturations on room air now are 95%. She reports feeling improved as far as her breathing. She has an obviously productive cough of yellowish, greenish in mildly bloody sputum. She denies any night sweats or weight loss. She reports that she used to have pneumonia multiple times as a child. She was seen by her primary care physician 5  days ago and was given [employer] and a Z-Pak which she has been taking. She reports her symptoms are getting worse and not better. He is allergic to sulfa any buttocks but has been on Z-Pak and fluoroquinolones multiple times in the past for upper respiratory type infections. She does not currently have a inhaler.      10:39 AM I went in to reassess the patient and she was awake and sitting up however her room air saturations were down to 84%. As she sat up further and took a few deep breaths her sats did improve to about 94%. However given her low saturations I feel the patient does require at least a brief admission for continued breathing treatments and continued monitoring for improvement. Clinically I feel the patient does have pneumonia despite her chest x-ray showing only bronchitis. Plan is to give her another breathing treatment, obtain routine labs and admit her for further treatment to internal medicine.   Gavin Pound. Dvontae Ruan, MD 07/15/11 1136

## 2011-07-14 NOTE — H&P (Signed)
Hospital Admission Note Date: 07/14/2011  PCP: Cala [place], MD  Chief Complaint: Shortness of breath.  History of Present Illness: 30 year old with past medical history significant for asthma, presents to the emergency department complaining of worsening shortness of breath. 2 weeks  prior to admission she had sinus infection. 4 days prior to admission she started to have productive cough, headache. At that time she saw her primary care physician and she was prescribed azithromycin, naproxen, and cough medicine. She presented today to the medicine apartment with oh worsening shortness of breath the morning of admission. She has been cough and some reddish sputum, she denies fever. She has her usual  general muscle pain, from  Fibromyalgia. She was hypoxic when she arrived to the emergency department, she was in 80-85% on room air. She is now satting 91-96 on 2 L. She said that she has been using Qvar and proventil more frequently. She relates occasional chest pain when she cough.   Allergies: Amoxicillin; Sulfonamide derivatives; and Latex Past Medical History  Diagnosis Date  . Pneumonia   . Asthma   . Fibromyalgia   . Arthritis     - Anxiety  Prior to Admission medications   Medication Sig Start Date End Date Taking? Authorizing Provider  albuterol (PROVENTIL HFA;VENTOLIN HFA) 108 (90 BASE) MCG/ACT inhaler Inhale 2 puffs into the lungs every 6 (six) hours as needed. Shortness of breath    Yes Historical Provider, MD  ALPRAZolam (XANAX) 1 MG tablet Take 1.5 mg by mouth 3 (three) times daily as needed. anxiety   Yes Historical Provider, MD  azithromycin (ZITHROMAX) 250 MG tablet Take 250 mg by mouth daily. zpak-on day 4    Yes Historical Provider, MD  beclomethasone (QVAR) 80 MCG/ACT inhaler Inhale 1 puff into the lungs as needed. Asthma-cough    Yes Historical Provider, MD  benzonatate (TESSALON) 100 MG capsule Take 100 mg by mouth 3 (three) times daily as needed.     Yes Historical  Provider, MD  calcium-vitamin D (OSCAL WITH D) 500-200 MG-UNIT per tablet Take 1 tablet by mouth daily.     Yes Historical Provider, MD  cetirizine-pseudoephedrine (ZYRTEC-D) 5-120 MG per tablet Take 1 tablet by mouth 2 (two) times daily. allergies    Yes Historical Provider, MD  dextromethorphan-guaiFENesin (MUCINEX DM) 30-600 MG per 12 hr tablet Take 1 tablet by mouth every 12 (twelve) hours.     Yes Historical Provider, MD  docusate sodium (COLACE) 100 MG capsule Take 100 mg by mouth 2 (two) times daily.     Yes Historical Provider, MD  FLUoxetine (PROZAC) 10 MG capsule Take 40 mg by mouth daily.    Yes Historical Provider, MD  HYDROcodone-acetaminophen (NORCO) 10-325 MG per tablet Take 1 tablet by mouth every 6 (six) hours as needed.     Yes Historical Provider, MD  meclizine (ANTIVERT) 25 MG tablet Take 25 mg by mouth 3 (three) times daily as needed. nausea    Yes Historical Provider, MD  Multiple Vitamins-Minerals (MULTIVITAMINS THER. W/MINERALS) TABS Take 1 tablet by mouth daily.     Yes Historical Provider, MD  naproxen sodium (ANAPROX) 550 MG tablet Take 550 mg by mouth 2 (two) times daily with a meal.     Yes Historical Provider, MD  polyethylene glycol (MIRALAX / GLYCOLAX) packet Take 17 g by mouth daily.     Yes Historical Provider, MD  SUMAtriptan (IMITREX) 100 MG tablet Take 100 mg by mouth every 2 (two) hours as needed.  Yes Historical Provider, MD  tiZANidine (ZANAFLEX) 4 MG capsule Take 4 mg by mouth 3 (three) times daily.     Yes Historical Provider, MD     History   Social History  . Marital Status: Divorced            .     Occupational History  .  works for [employer]    Social History Main Topics  . Smoking status: Never Smoker   . Smokeless tobacco: Never Used  . Alcohol Use: No  . Drug Use: No  . Sexually Active:      Review of Systems: Pertinent items are noted in HPI. Physical Exam: Filed Vitals:   07/14/11 0658 07/14/11 0900 07/14/11 0923 07/14/11 1101    BP: 145/101  138/77   Pulse: 82 80 109   Temp: 99.7 F (37.6 C)  98.2 F (36.8 C)   TempSrc: Oral  Oral   Resp: 18 15 25    SpO2: 92% 100% 98% 100%   No intake or output data in the 24 hours ending 07/14/11 1253 BP 138/77  Pulse 109  Temp(Src) 98.2 F (36.8 C) (Oral)  Resp 25  SpO2 100%  LMP 07/14/2011  General Appearance:    Alert, cooperative, no distress, appears stated age  Head:    Normocephalic, without obvious abnormality, atraumatic  Eyes:    PERRL, conjunctiva/corneas clear, EOM's intact,   Ears:    Normal TM's and external ear canals, both ears  Nose:   Nares normal, septum midline, mucosa normal, no drainage    or sinus tenderness  Throat:   Lips, mucosa, and tongue normal; teeth and gums normal  Neck:   Supple, symmetrical, trachea midline, no adenopathy;    thyroid:  no enlargement/tenderness/nodules; no carotid   bruit or JVD  Back:     Symmetric, no curvature, ROM normal, no CVA tenderness  Lungs:     bilateral rhonchi, sporadic expiratory wheezes, respirations unlabored      Heart:    Regular rate and rhythm, S1 and S2 normal, no murmur, rub   or gallop     Abdomen:     Soft, non-tender, bowel sounds active all four quadrants,    no masses, no organomegaly  Genitalia:   not perform   Rectal:    Not perform  Extremities:   Extremities normal, atraumatic, no cyanosis or edema  Pulses:   2+ and symmetric all extremities  Skin:   Skin color, texture, turgor normal, no rashes or lesions     Neurologic:   CNII-XII intact, normal strength, sensation and reflexes    throughout   Lab results:  Basename 07/14/11 1055  NA 138  K 3.5  CL 105  CO2 25  GLUCOSE 99  BUN 7  CREATININE 0.66  CALCIUM 8.9  MG --  PHOS --   Basename 07/14/11 1055  WBC 10.5  NEUTROABS 8.6*  HGB 10.3*  HCT 31.2*  MCV 84.1  PLT 199   Imaging results:  Dg Chest 2 View  07/14/2011  *RADIOLOGY REPORT*  Clinical Data: Cough  CHEST - 2 VIEW  Comparison: 07/03/2004  Findings:  Bronchitic changes.  Normal heart size.  No consolidation, pneumothorax, pleural effusion.  IMPRESSION: Bronchitic changes  Original Report Authenticated By: Donavan Burnet, M.D.      Patient Active Hospital Problem List:  Hypoxemia requiring supplemental oxygen (07/14/2011) This is probably secondary to asthma exacerbation in the setting of acute bronchitis.   I will treat for asthma exacerbation,  will start Avelox to cover for any bacterial infection. I will start Solu-Medrol, albuterol. Will check a sputum culture, blood culture , HIV. Although she has had symptoms for more than  a week,  I will check for influenza. Her shortness of breath has improved nebulizer treatment.  Asthma (07/14/2011)  I will  start albuterol, Solu-Medrol.   ANXIETY (04/27/2009)   Continue with Xanax when necessary.  Chronic pain syndrome:  Continue with hydrocodone. Anemia: Probably iron deficiency: I will check anemia panel. Hemoptysis: Probably secondary to acute bronchitis. See problem #1.  Sherle Mello M.D. [facility] [phone] 07/14/2011, 12:53 PM

## 2011-07-15 DIAGNOSIS — D509 Iron deficiency anemia, unspecified: Secondary | ICD-10-CM | POA: Diagnosis present

## 2011-07-15 DIAGNOSIS — J101 Influenza due to other identified influenza virus with other respiratory manifestations: Secondary | ICD-10-CM | POA: Diagnosis present

## 2011-07-15 LAB — INFLUENZA PANEL BY PCR (TYPE A & B): H1N1 flu by pcr: NOT DETECTED

## 2011-07-15 LAB — COMPREHENSIVE METABOLIC PANEL
ALT: 41 U/L — ABNORMAL HIGH (ref 0–35)
CO2: 23 mEq/L (ref 19–32)
Calcium: 9.2 mg/dL (ref 8.4–10.5)
Chloride: 103 mEq/L (ref 96–112)
Creatinine, Ser: 0.5 mg/dL (ref 0.50–1.10)
GFR calc Af Amer: >90 mL/min (ref >90)
GFR calc non Af Amer: >90 mL/min (ref >90)
Glucose, Bld: 215 mg/dL — ABNORMAL HIGH (ref 70–99)
Total Bilirubin: 0.1 mg/dL — ABNORMAL LOW (ref 0.3–1.2)

## 2011-07-15 LAB — CBC
Hemoglobin: 9.8 g/dL — ABNORMAL LOW (ref 12.0–15.0)
MCHC: 32.9 g/dL (ref 30.0–36.0)
Platelets: 221 10*3/uL (ref 150–400)
RBC: 3.57 MIL/uL — ABNORMAL LOW (ref 3.87–5.11)

## 2011-07-15 MED ORDER — PREDNISONE 20 MG PO TABS: 40.0000 mg | ORAL_TABLET | Freq: Every day | ORAL | Status: DC

## 2011-07-15 MED ORDER — MOXIFLOXACIN HCL 400 MG PO TABS
400.0000 mg | ORAL_TABLET | Freq: Every day | ORAL | Status: DC
  Administered 2011-07-15: 400 mg via ORAL
  Filled 2011-07-15: qty 1

## 2011-07-15 MED ORDER — FERROUS SULFATE 325 (65 FE) MG PO TABS: 325.0000 mg | ORAL_TABLET | Freq: Two times a day (BID) | ORAL | Status: DC

## 2011-07-15 MED ORDER — AZITHROMYCIN 250 MG PO TABS: ORAL_TABLET | ORAL | Status: DC

## 2011-07-15 MED ORDER — FERROUS SULFATE 325 (65 FE) MG PO TABS
325.0000 mg | ORAL_TABLET | Freq: Two times a day (BID) | ORAL | Status: DC
  Administered 2011-07-15 (×2): 325 mg via ORAL
  Filled 2011-07-15 (×2): qty 1

## 2011-07-15 NOTE — Discharge Summary (Signed)
Physician Discharge Summary  Patient ID: Kathy Hickman MRN: [id number] DOB/AGE: March 28, 1981 30 y.o.  Admit date: 07/14/2011 Discharge date: 07/15/2011  Primary Care Physician:  Kathy Bradford, MD   Discharge Diagnoses:    Present on Admission:  .ANXIETY .Hypoxemia requiring supplemental oxygen secondary to influenza A .Asthma .Iron deficiency anemia  Discharge Medications:  Current Discharge Medication List    START taking these medications   Details  ferrous sulfate 325 (65 FE) MG tablet Take 1 tablet (325 mg total) by mouth 2 (two) times daily with a meal. Qty: 60 tablet, Refills: 2      CONTINUE these medications which have CHANGED   Details  azithromycin (ZITHROMAX) 250 MG tablet zpak-on day 4 Qty: 6 each, Refills: 0      CONTINUE these medications which have NOT CHANGED   Details  albuterol (PROVENTIL HFA;VENTOLIN HFA) 108 (90 BASE) MCG/ACT inhaler Inhale 2 puffs into the lungs every 6 (six) hours as needed. Shortness of breath     ALPRAZolam (XANAX) 1 MG tablet Take 1.5 mg by mouth 3 (three) times daily as needed. anxiety    beclomethasone (QVAR) 80 MCG/ACT inhaler Inhale 1 puff into the lungs as needed. Asthma-cough     benzonatate (TESSALON) 100 MG capsule Take 100 mg by mouth 3 (three) times daily as needed.      calcium-vitamin D (OSCAL WITH D) 500-200 MG-UNIT per tablet Take 1 tablet by mouth daily.      cetirizine-pseudoephedrine (ZYRTEC-D) 5-120 MG per tablet Take 1 tablet by mouth 2 (two) times daily. allergies     dextromethorphan-guaiFENesin (MUCINEX DM) 30-600 MG per 12 hr tablet Take 1 tablet by mouth every 12 (twelve) hours.      docusate sodium (COLACE) 100 MG capsule Take 100 mg by mouth 2 (two) times daily.      FLUoxetine (PROZAC) 10 MG capsule Take 40 mg by mouth daily.     HYDROcodone-acetaminophen (NORCO) 10-325 MG per tablet Take 1 tablet by mouth every 6 (six) hours as needed.      meclizine (ANTIVERT) 25 MG tablet Take 25 mg by  mouth 3 (three) times daily as needed. nausea     Multiple Vitamins-Minerals (MULTIVITAMINS THER. W/MINERALS) TABS Take 1 tablet by mouth daily.      naproxen sodium (ANAPROX) 550 MG tablet Take 550 mg by mouth 2 (two) times daily with a meal.      polyethylene glycol (MIRALAX / GLYCOLAX) packet Take 17 g by mouth daily.      SUMAtriptan (IMITREX) 100 MG tablet Take 100 mg by mouth every 2 (two) hours as needed.      tiZANidine (ZANAFLEX) 4 MG capsule Take 4 mg by mouth 3 (three) times daily.           Disposition and Follow-up: The patient is being discharged home.  She is instructed to F/U with her PCP in 1 week.  Consults: None  Significant Diagnostic Studies:  Dg Chest 2 View  07/14/2011  *RADIOLOGY REPORT*  Clinical Data: Cough  CHEST - 2 VIEW  Comparison: 07/03/2004  Findings: Bronchitic changes.  Normal heart size.  No consolidation, pneumothorax, pleural effusion.  IMPRESSION: Bronchitic changes  Original Report Authenticated By: Donavan Burnet, M.D.    Discharge Laboratory Values: Basic Metabolic Panel:  Lab 07/15/11 4098 07/14/11 1412 07/14/11 1055  NA 134* -- 138  K 3.7 -- 3.5  CL 103 -- 105  CO2 23 -- 25  GLUCOSE 215* -- 99  BUN 7 -- 7  CREATININE 0.50 -- 0.66  CALCIUM 9.2 -- 8.9  MG -- 2.0 --  PHOS -- -- --   GFR Estimated Creatinine Clearance: 113.6 ml/min (by C-G formula based on Cr of 0.5). Liver Function Tests:  Lab 07/15/11 0436  AST 38*  ALT 41*  ALKPHOS 77  BILITOT 0.1*  PROT 6.4  ALBUMIN 2.8*    CBC:  Lab 07/15/11 0436 07/14/11 1055  WBC 11.9* 10.5  NEUTROABS -- 8.6*  HGB 9.8* 10.3*  HCT 29.8* 31.2*  MCV 83.5 84.1  PLT 221 199   Anemia work up  [employer] 07/14/11 1412  VITAMINB12 1070*  FOLATE >20.0  FERRITIN 116  TIBC Not calculated due to Iron <10.  IRON <10*  RETICCTPCT 1.2     Brief H and P: For complete details please refer to admission H and P, but in brief, Kathy Hickman is a 30 year old female with a past medical  history of asthma who presents to the hospital with influenza-like symptoms. She had been sick for several days, and therefore was not put on Tamiflu.   Physical Exam at Discharge: BP 130/77  Pulse 53  Temp(Src) 98.4 F (36.9 C) (Oral)  Resp 18  Ht 5\' 5"  (1.651 m)  Wt 89.5 kg (197 lb 5 oz)  BMI 32.83 kg/m2  SpO2 100%  LMP 07/14/2011 Gen: Anxious but no acute distress.  Cardiovascular: Heart sounds regular with no murmurs, rubs, or gallops.  Respiratory: Lungs sound clear to auscultation bilaterally with no rhonchi, rales, or wheezes.  Gastrointestinal: Abdomen soft, nontender, nondistended with normal active bowel sounds.  Extremities: No clubbing, edema, or cyanosis.     Hospital Course:  Principal Problem:  *Hypoxemia requiring supplemental oxygen secondary to influenza A The patient was admitted and put on Avelox.  Influenza by rapid PCP showed positivity for influenza A.  The patient was not put on tamiflu since she had symptoms for 5 days prior to presentation.  She was given Avelox, given her history of asthma, and high risk for bacterial superinfection.  We will d/c her on a 6 day course of azithromycin. Active Problems:  Asthma The patient was given IV solumedrol.  By the morning, her bronchospasm had resolved and she is maintaining her oxygen saturations well.  Plan is to d/c her home off steroids.  Iron deficiency anemia This was felt to be secondary to menstruation.  She was put on iron supplementation.  ANXIETY Chronic.  She was continued on prn Ativan.   Time spent on Discharge: 25 minutes.  Signed: Dr. Trula Ore Rama Pager [phone] 07/15/2011, 2:53 PM   /

## 2011-07-15 NOTE — Progress Notes (Signed)
PATIENT DETAILS Name: Kathy Hickman Age: 30 y.o. Sex: female Date of Birth: July 01, 1981 Admit Date: 07/14/2011 ZOX:[id number] S, MD  CONSULTS: None  Interval History: Kathy Hickman is a 30 year old female with a past medical history of asthma who presents to the hospital with influenza-like symptoms. She has been sick for several days, and therefore has not been put on Tamiflu. Her influenza studies are still pending. She has been put on empiric Avelox for treatment of bronchitis. She was admitted to the hospital due to hypoxia on pulse oximetry.  ROS: Kathy Hickman has multiple complaints this morning. She is complaining that she is having pain from her fibromyalgia and is requesting pain medicine. She is complaining of anxiety and requesting Xanax. She has a headache and states that her migraines have been acting up since she's been sick. She complains of being tight chested and short of breath at times.   Objective: Vital signs in last 24 hours: Temp:  [98 F (36.7 C)-98.5 F (36.9 C)] 98.4 F (36.9 C) (12/10 0515) Pulse Rate:  [53-109] 53  (12/10 0515) Resp:  [17-25] 18  (12/10 0515) BP: (119-140)/(77-93) 130/77 mmHg (12/10 0515) SpO2:  [93 %-100 %] 98 % (12/10 0749) Weight:  [89.5 kg (197 lb 5 oz)] 197 lb 5 oz (89.5 kg) (12/09 1542) Weight change:     Intake/Output from previous day:  Intake/Output Summary (Last 24 hours) at 07/15/11 9147 Last data filed at 07/15/11 0600  Gross per 24 hour  Intake 1438.75 ml  Output      3 ml  Net 1435.75 ml     Physical Exam:  Gen:  Anxious but no acute distress. Cardiovascular:  Heart sounds regular with no murmurs, rubs, or gallops. Respiratory: Lungs sound clear to auscultation bilaterally with no rhonchi, rales, or wheezes. Gastrointestinal: Abdomen soft, nontender, nondistended with normal active bowel sounds. Extremities: No clubbing, edema, or cyanosis.   Lab Results: Basic Metabolic Panel:  Lab 07/15/11 8295 07/14/11  1412 07/14/11 1055  NA 134* -- 138  K 3.7 -- 3.5  CL 103 -- 105  CO2 23 -- 25  GLUCOSE 215* -- 99  BUN 7 -- 7  CREATININE 0.50 -- 0.66  CALCIUM 9.2 -- 8.9  MG -- 2.0 --  PHOS -- -- --   GFR Estimated Creatinine Clearance: 113.6 ml/min (by C-G formula based on Cr of 0.5). Liver Function Tests:  Lab 07/15/11 0436  AST 38*  ALT 41*  ALKPHOS 77  BILITOT 0.1*  PROT 6.4  ALBUMIN 2.8*    CBC:  Lab 07/15/11 0436 07/14/11 1055  WBC 11.9* 10.5  NEUTROABS -- 8.6*  HGB 9.8* 10.3*  HCT 29.8* 31.2*  MCV 83.5 84.1  PLT 221 199   Anemia work up  [employer] 07/14/11 1412  VITAMINB12 1070*  FOLATE >20.0  FERRITIN 116  TIBC Not calculated due to Iron <10.  IRON <10*  RETICCTPCT 1.2    Recent Results (from the past 240 hour(s))  CULTURE, SPUTUM-ASSESSMENT     Status: Normal   Collection Time   07/14/11  8:01 AM      Component Value Range Status Comment   Specimen Description SPUTUM   Final    Special Requests NONE   Final    Sputum evaluation     Final    Value: THIS SPECIMEN IS ACCEPTABLE. RESPIRATORY CULTURE REPORT TO FOLLOW.   Report Status 07/14/2011 FINAL   Final   CULTURE, BLOOD (ROUTINE X 2)     Status: Normal (Preliminary  result)   Collection Time   07/14/11  2:12 PM      Component Value Range Status Comment   Specimen Description BLOOD LEFT ARM  5 ML IN [facility] BOTTLE   Final    Special Requests NONE   Final    Setup Time [id number]   Final    Culture     Final    Value:        BLOOD CULTURE RECEIVED NO GROWTH TO DATE CULTURE WILL BE HELD FOR 5 DAYS BEFORE ISSUING A FINAL NEGATIVE REPORT   Report Status PENDING   Incomplete   CULTURE, BLOOD (ROUTINE X 2)     Status: Normal (Preliminary result)   Collection Time   07/14/11  2:20 PM      Component Value Range Status Comment   Specimen Description BLOOD LEFT HAND  4 ML IN [facility] BOTTLE   Final    Special Requests NONE   Final    Setup Time [id number]   Final    Culture     Final    Value:        BLOOD CULTURE  RECEIVED NO GROWTH TO DATE CULTURE WILL BE HELD FOR 5 DAYS BEFORE ISSUING A FINAL NEGATIVE REPORT   Report Status PENDING   Incomplete     Studies/Results: Dg Chest 2 View  07/14/2011  *RADIOLOGY REPORT*  Clinical Data: Cough  CHEST - 2 VIEW  Comparison: 07/03/2004  Findings: Bronchitic changes.  Normal heart size.  No consolidation, pneumothorax, pleural effusion.  IMPRESSION: Bronchitic changes  Original Report Authenticated By: Donavan Burnet, M.D.    Medications: Scheduled Meds:   . albuterol  2.5 mg Nebulization Q6H  . albuterol  5 mg Nebulization Once  . calcium-vitamin D  1 tablet Oral Daily  . dextromethorphan-guaiFENesin  1 tablet Oral BID  . docusate sodium  100 mg Oral BID  . FLUoxetine  40 mg Oral Daily  . fluticasone  1 puff Inhalation BID  . LORazepam  1 mg Oral Once  . methylPREDNISolone (SOLU-MEDROL) injection  125 mg Intravenous Once  . methylPREDNISolone (SOLU-MEDROL) injection  40 mg Intravenous Q6H  . moxifloxacin  400 mg Intravenous Q24H  . multivitamins ther. w/minerals  1 tablet Oral Daily  . polyethylene glycol  17 g Oral Daily   Continuous Infusions:   . sodium chloride 75 mL/hr at 07/14/11 2219   PRN Meds:.acetaminophen, acetaminophen, albuterol, albuterol, ALPRAZolam, benzonatate, guaiFENesin-dextromethorphan, HYDROcodone-acetaminophen, ondansetron (ZOFRAN) IV, ondansetron, SUMAtriptan, tiZANidine Antibiotics: Anti-infectives     Start     Dose/Rate Route Frequency Ordered Stop   07/14/11 1345   moxifloxacin (AVELOX) IVPB 400 mg        400 mg 250 mL/hr over 60 Minutes Intravenous Every 24 hours 07/14/11 1334     07/14/11 0745   moxifloxacin (AVELOX) tablet 400 mg        400 mg Oral  Once 07/14/11 0743 07/14/11 0759           Assessment/Plan:  Principal Problem:  *Hypoxemia requiring supplemental oxygen Assessment: Likely secondary to bronchitis. May also have influenza. Plan: Continue empiric Avelox and supplemental oxygen. Ambulate  patient in hallway and check oxygen saturations. She is able to maintain her oxygen saturation greater than 92%, would plan to discharge her home later today. Active Problems:  Asthma Assessment: Controlled. No bronchospasm heard on exam today. Plan: Wean steroids. Continue empiric Avelox for now.  Iron deficiency anemia Assessment: Secondary to menstrual losses. Plan: Start iron therapy.  ANXIETY Assessment:  Chronic generalized anxiety disorder. Plan: Continue when necessary Xanax.    LOS: 1 day   Hillery Aldo, MD Pager [phone]  07/15/2011, 9:22 AM

## 2011-07-17 LAB — CULTURE, RESPIRATORY W GRAM STAIN: Culture: NORMAL

## 2011-07-20 LAB — CULTURE, BLOOD (ROUTINE X 2)
Culture  Setup Time: 201212091802
Culture: NO GROWTH

## 2012-02-18 ENCOUNTER — Emergency Department (HOSPITAL_COMMUNITY): Payer: BC Managed Care – PPO

## 2012-02-18 ENCOUNTER — Emergency Department (HOSPITAL_COMMUNITY)
Admission: EM | Admit: 2012-02-18 | Discharge: 2012-02-19 | Disposition: A | Discharge status: Home / Self Care | Payer: BC Managed Care – PPO | Attending: Emergency Medicine | Admitting: Emergency Medicine

## 2012-02-18 ENCOUNTER — Encounter (HOSPITAL_COMMUNITY): Payer: Self-pay | Admitting: *Deleted

## 2012-02-18 DIAGNOSIS — J45909 Unspecified asthma, uncomplicated: Secondary | ICD-10-CM | POA: Insufficient documentation

## 2012-02-18 DIAGNOSIS — S81009A Unspecified open wound, unspecified knee, initial encounter: Secondary | ICD-10-CM | POA: Insufficient documentation

## 2012-02-18 DIAGNOSIS — M129 Arthropathy, unspecified: Secondary | ICD-10-CM | POA: Insufficient documentation

## 2012-02-18 DIAGNOSIS — W268XXA Contact with other sharp object(s), not elsewhere classified, initial encounter: Secondary | ICD-10-CM | POA: Insufficient documentation

## 2012-02-18 DIAGNOSIS — W19XXXA Unspecified fall, initial encounter: Secondary | ICD-10-CM

## 2012-02-18 DIAGNOSIS — Y9301 Activity, walking, marching and hiking: Secondary | ICD-10-CM | POA: Insufficient documentation

## 2012-02-18 DIAGNOSIS — Y998 Other external cause status: Secondary | ICD-10-CM | POA: Insufficient documentation

## 2012-02-18 DIAGNOSIS — IMO0002 Reserved for concepts with insufficient information to code with codable children: Secondary | ICD-10-CM

## 2012-02-18 DIAGNOSIS — Y92009 Unspecified place in unspecified non-institutional (private) residence as the place of occurrence of the external cause: Secondary | ICD-10-CM | POA: Insufficient documentation

## 2012-02-18 DIAGNOSIS — W01119A Fall on same level from slipping, tripping and stumbling with subsequent striking against unspecified sharp object, initial encounter: Secondary | ICD-10-CM | POA: Insufficient documentation

## 2012-02-18 DIAGNOSIS — T148XXA Other injury of unspecified body region, initial encounter: Secondary | ICD-10-CM

## 2012-02-18 DIAGNOSIS — IMO0001 Reserved for inherently not codable concepts without codable children: Secondary | ICD-10-CM | POA: Insufficient documentation

## 2012-02-18 MED ORDER — OXYCODONE-ACETAMINOPHEN 5-325 MG PO TABS
2.0000 | ORAL_TABLET | Freq: Once | ORAL | Status: AC
  Administered 2012-02-19: 2 via ORAL
  Filled 2012-02-18: qty 2

## 2012-02-18 MED ORDER — TETANUS-DIPHTH-ACELL PERTUSSIS 5-2.5-18.5 LF-MCG/0.5 IM SUSP
0.5000 mL | Freq: Once | INTRAMUSCULAR | Status: AC
  Administered 2012-02-19: 0.5 mL via INTRAMUSCULAR
  Filled 2012-02-18: qty 0.5

## 2012-02-18 MED ORDER — LIDOCAINE-PRILOCAINE 2.5-2.5 % EX CREA
TOPICAL_CREAM | Freq: Once | CUTANEOUS | Status: AC
  Administered 2012-02-19: via TOPICAL
  Filled 2012-02-18: qty 5

## 2012-02-18 NOTE — ED Notes (Signed)
Pt was moving furniture on the deck and fell through.  Abrasions w/possible splinters to LLE.  Also c/o vaginal pain stating when she fell through she hit her vagina-she was in a tshirt and underware so unsure if she has a splinter in her vag area as well.

## 2012-02-18 NOTE — ED Notes (Signed)
Pt states "I fell through my back porch." States there was weak wood and when she stepped on it fell through wood. Pt has laceration to left leg, bleeding controlled. Pt needs updated tetanus shot.

## 2012-02-19 MED ORDER — CEPHALEXIN 250 MG PO CAPS
250.0000 mg | ORAL_CAPSULE | Freq: Once | ORAL | Status: DC
  Filled 2012-02-19: qty 1

## 2012-02-19 MED ORDER — CIPROFLOXACIN HCL 500 MG PO TABS: 500.0000 mg | ORAL_TABLET | Freq: Two times a day (BID) | ORAL | Status: AC

## 2012-02-19 MED ORDER — CEPHALEXIN 500 MG PO CAPS: 500.0000 mg | ORAL_CAPSULE | Freq: Four times a day (QID) | ORAL | Status: DC

## 2012-02-19 MED ORDER — CIPROFLOXACIN HCL 500 MG PO TABS
500.0000 mg | ORAL_TABLET | Freq: Once | ORAL | Status: AC
  Administered 2012-02-19: 500 mg via ORAL
  Filled 2012-02-19: qty 1

## 2012-02-19 NOTE — ED Notes (Signed)
Ortho tech at bedside for crutch fitting and instruction.

## 2012-02-19 NOTE — ED Provider Notes (Signed)
History     CSN: [id number]  Arrival date & time 02/18/12  2139   First MD Initiated Contact with Patient 02/18/12 2325      Chief Complaint  Patient presents with  . Extremity Laceration    (Consider location/radiation/quality/duration/timing/severity/associated sxs/prior treatment) HPI  Pt presents to the ER after stepping on a old wooden board on her porch and her foot falling through. She presents with superficial lacerations to her left shin, bruising to her ankle and knee. She also has wood splinters in shin. She admits to being part of pain management clinic for her fibromyalgia. She denies head or neck injury. Pt is in NAD and VSS.   Past Medical History  Diagnosis Date  . Pneumonia   . Asthma   . Fibromyalgia   . Arthritis     History reviewed. No pertinent past surgical history.  History reviewed. No pertinent family history.  History  Substance Use Topics  . Smoking status: Never Smoker   . Smokeless tobacco: Never Used  . Alcohol Use: No    OB History    Grav Para Term Preterm Abortions TAB SAB Ect Mult Living                  Review of Systems   HEENT: denies blurry vision or change in hearing PULMONARY: Denies difficulty breathing and SOB CARDIAC: denies chest pain or heart palpitations MUSCULOSKELETAL:  denies being unable to ambulate ABDOMEN AL: denies abdominal pain GU: denies loss of bowel or urinary control NEURO: denies numbness and tingling in extremities SKIN: no new rashes PSYCH: patient denies anxiety or depression. NECK: Pt denies having neck pain    Allergies  Amoxicillin; Sulfonamide derivatives; and Latex  Home Medications   Current Outpatient Rx  Name Route Sig Dispense Refill  . ALPRAZOLAM 1 MG PO TABS Oral Take 2 mg by mouth 3 (three) times daily as needed. anxiety    . BENZONATATE 100 MG PO CAPS Oral Take 100 mg by mouth 3 (three) times daily as needed.      Marland Kitchen CETIRIZINE-PSEUDOEPHEDRINE ER 5-120 MG PO TB12 Oral Take  1 tablet by mouth 2 (two) times daily. allergies    . FLUOXETINE HCL 10 MG PO CAPS Oral Take 30 mg by mouth daily.     Marland Kitchen HYDROCODONE-ACETAMINOPHEN 10-325 MG PO TABS Oral Take 1 tablet by mouth every 6 (six) hours as needed.      Marland Kitchen MECLIZINE HCL 25 MG PO TABS Oral Take 25 mg by mouth 3 (three) times daily as needed. nausea     . METHOCARBAMOL 750 MG PO TABS Oral Take 750 mg by mouth 3 (three) times daily.    . MOMETASONE FURO-FORMOTEROL FUM 100-5 MCG/ACT IN AERO Inhalation Inhale 2 puffs into the lungs 2 (two) times daily.    Carma Leaven M PLUS PO TABS Oral Take 1 tablet by mouth daily.      Marland Kitchen NAPROXEN SODIUM 550 MG PO TABS Oral Take 550 mg by mouth 2 (two) times daily with a meal.      . POLYETHYLENE GLYCOL 3350 PO PACK Oral Take 17 g by mouth daily.      . SUMATRIPTAN SUCCINATE 100 MG PO TABS Oral Take 100 mg by mouth every 2 (two) hours as needed.      . CEPHALEXIN 500 MG PO CAPS Oral Take 1 capsule (500 mg total) by mouth 4 (four) times daily. 28 capsule 0    BP 112/60  Pulse 72  Temp 98.4  F (36.9 C) (Oral)  Resp 18  SpO2 98%  LMP 02/12/2012  Physical Exam  Nursing note and vitals reviewed. Constitutional: She appears well-developed and well-nourished. No distress.  HENT:  Head: Normocephalic and atraumatic.  Eyes: Pupils are equal, round, and reactive to light.  Neck: Normal range of motion. Neck supple.  Cardiovascular: Normal rate and regular rhythm.   Pulmonary/Chest: Effort normal.  Abdominal: Soft.  Musculoskeletal:       Left knee: She exhibits swelling, ecchymosis, laceration and abnormal alignment. She exhibits normal range of motion, no effusion, no deformity, no erythema, no LCL laxity and normal patellar mobility. no tenderness found.       Left ankle: She exhibits ecchymosis. She exhibits no deformity and no laceration.       Legs:      Small piece of wood in patients laceration. Removed and wound thoroughly cleaned. Wound is very superficial  Neurological: She is  alert.  Skin: Skin is warm and dry.    ED Course  Procedures (including critical care time)  Labs Reviewed - No data to display Dg Lumbar Spine Complete  02/19/2012  *RADIOLOGY REPORT*  Clinical Data: Low back pain after fall.  LUMBAR SPINE - COMPLETE 4+ VIEW  Comparison: Abdomen 04/18/2009  Findings: Five lumbar type vertebra.  Normal alignment of lumbar vertebrae and facet joints.  No vertebral compression deformities. Intervertebral disc space heights are preserved.  No focal bone lesion or bone destruction.  Bone cortex and trabecular architecture appear intact.  IMPRESSION: No displaced fractures identified.  Original Report Authenticated By: Marlon Pel, M.D.   Dg Tibia/fibula Left  02/19/2012  *RADIOLOGY REPORT*  Clinical Data: Left lower leg pain after fall.  Lacerations.  LEFT TIBIA AND FIBULA - 2 VIEW  Comparison: Left knee 11/26/2005  Findings: The left tibia and fibula appear intact. No evidence of acute fracture or subluxation.  No focal bone lesions.  Bone matrix and cortex appear intact.  No abnormal radiopaque densities in the soft tissues.  IMPRESSION: No acute bony abnormalities.  Original Report Authenticated By: Marlon Pel, M.D.   Dg Ankle Complete Left  02/19/2012  *RADIOLOGY REPORT*  Clinical Data: Left ankle pain after fall.  LEFT ANKLE COMPLETE - 3+ VIEW  Comparison: None.  Findings: Accessory os trigonum.  Accessory ossicles adjacent to the cuboidal bone.  Plantar calcaneal spurs. No evidence of acute fracture or subluxation.  No focal bone lesions.  Bone matrix and cortex appear intact.  No abnormal radiopaque densities in the soft tissues.  IMPRESSION: No acute bony abnormalities.  Original Report Authenticated By: Marlon Pel, M.D.     1. Fall   2. Abrasion   3. Foreign body in hip or leg       MDM    Pt given tetanus shot and Cipro in ED (allergies to amoxicillin and Sulfas). Also given Rx for the same. Wounds are very superficial, xrays  negative. PT given crutches and work-note since she says she is sore.  Pt part of pain management clinic, asked to follow-up with Dr. Cliffton Asters.  Pt has been advised of the symptoms that warrant their return to the ED. Patient has voiced understanding and has agreed to follow-up with the PCP or specialist.         Dorthula Matas, PA 02/19/12 0102  Dorthula Matas, PA 02/19/12 4540

## 2012-02-19 NOTE — ED Notes (Signed)
PA Greene at bedside suturing pt.

## 2012-02-19 NOTE — ED Provider Notes (Signed)
Medical screening examination/treatment/procedure(s) were performed by non-physician practitioner and as supervising physician I was immediately available for consultation/collaboration.   Hanley Seamen, MD 02/19/12 [phone]

## 2012-02-19 NOTE — ED Notes (Signed)
Patient returned from X-ray 

## 2012-03-17 ENCOUNTER — Ambulatory Visit
Admission: RE | Admit: 2012-03-17 | Discharge: 2012-03-17 | Disposition: A | Discharge status: Home / Self Care | Payer: BC Managed Care – PPO | Source: Ambulatory Visit | Attending: Family Medicine | Admitting: Family Medicine

## 2012-03-17 ENCOUNTER — Other Ambulatory Visit: Payer: Self-pay | Admitting: Family Medicine

## 2012-03-17 DIAGNOSIS — R51 Headache: Secondary | ICD-10-CM

## 2012-04-17 ENCOUNTER — Other Ambulatory Visit: Payer: Self-pay | Admitting: Family Medicine

## 2012-04-17 ENCOUNTER — Other Ambulatory Visit (HOSPITAL_COMMUNITY)
Admission: RE | Admit: 2012-04-17 | Discharge: 2012-04-17 | Disposition: A | Discharge status: Home / Self Care | Payer: BC Managed Care – PPO | Source: Ambulatory Visit | Attending: Family Medicine | Admitting: Family Medicine

## 2012-04-17 DIAGNOSIS — Z113 Encounter for screening for infections with a predominantly sexual mode of transmission: Secondary | ICD-10-CM | POA: Insufficient documentation

## 2012-04-17 DIAGNOSIS — Z0000 Encounter for general adult medical examination without abnormal findings: Secondary | ICD-10-CM | POA: Insufficient documentation

## 2015-01-26 ENCOUNTER — Encounter: Payer: Self-pay | Admitting: *Deleted

## 2015-01-27 ENCOUNTER — Encounter: Payer: Self-pay | Admitting: Internal Medicine

## 2015-01-27 ENCOUNTER — Ambulatory Visit (INDEPENDENT_AMBULATORY_CARE_PROVIDER_SITE_OTHER): Payer: BLUE CROSS/BLUE SHIELD | Admitting: Internal Medicine

## 2015-01-27 VITALS — BP 126/82 | HR 96 | Temp 97.7°F | Resp 18 | Ht 65.0 in | Wt 216.6 lb

## 2015-01-27 DIAGNOSIS — M797 Fibromyalgia: Secondary | ICD-10-CM

## 2015-01-27 DIAGNOSIS — J301 Allergic rhinitis due to pollen: Secondary | ICD-10-CM

## 2015-01-27 DIAGNOSIS — F3341 Major depressive disorder, recurrent, in partial remission: Secondary | ICD-10-CM | POA: Diagnosis not present

## 2015-01-27 DIAGNOSIS — G894 Chronic pain syndrome: Secondary | ICD-10-CM | POA: Insufficient documentation

## 2015-01-27 DIAGNOSIS — D509 Iron deficiency anemia, unspecified: Secondary | ICD-10-CM

## 2015-01-27 DIAGNOSIS — F339 Major depressive disorder, recurrent, unspecified: Secondary | ICD-10-CM | POA: Insufficient documentation

## 2015-01-27 DIAGNOSIS — F411 Generalized anxiety disorder: Secondary | ICD-10-CM | POA: Diagnosis not present

## 2015-01-27 MED ORDER — METHYLPREDNISOLONE ACETATE 80 MG/ML IJ SUSP
80.0000 mg | Freq: Once | INTRAMUSCULAR | Status: AC
  Administered 2015-01-27: 80 mg via INTRAMUSCULAR

## 2015-01-27 NOTE — Patient Instructions (Addendum)
Will call with referral appt. Continue going to [facility] until you see new provider  Continue zyrtec D daily. Recommend saline nasal spray as needed to keep nose moist. Depo-medrol 80mg  given today  Continue current medications as ordered  Follow up with mental health as scheduled  Follow up in 1 month for CPE/pap.

## 2015-01-27 NOTE — Progress Notes (Signed)
Patient ID: Kathy Hickman, female   DOB: Jan 07, 1981, 34 y.o.   MRN: [id number]    Location:    [facility]   Place of Service:   OFFICE   Chief Complaint  Patient presents with  . Establish Care    Establish care    HPI:  34 yo female seen today as a new pt. She has chronic pain syndrome and is followed by pain clinic at [facility]. She is c/a "sitting all day" waiting to be seen. She also sees psych but recently learned that provider will no longer be working at the office. She has FMS and prefers a different pain clinic due to her new work schedule. She works for [employer] and cannot take full days to go to the doctor. She reports full compliance with pain regimen and UDS testing  She has anxiety and depression and is followed by Dr Candis Schatz. She has not switched providers yet. She takes prozac daily and prn alprazolam. Her alprazolam is written that she may take up to 5 tabs daily but states she only takes about 3 daily.  She is working fulltime with [employer] and reports back pain is worse. She is taking muscle relaxer as Rx.  She has migraine HA and takes imitrex prn.  Seasonal allergy stable overall with zyrtec D daily and flonase  She reports last labs done about 1 yr ago with CPE. Her last pap was about 2 yrs ago  Past Medical History  Diagnosis Date  . Pneumonia   . Asthma   . Fibromyalgia   . Allergy   . Arthritis     osteoarthritis  . Carpal tunnel syndrome   . Depression   . Anxiety     Past Surgical History  Procedure Laterality Date  . Cesarean section  2002    Dr. Ruthann Cancer    Patient Care Team: Harlan Stains, MD as PCP - General (Family Medicine)  History   Social History  . Marital Status: Divorced    Spouse Name: N/A  . Number of Children: N/A  . Years of Education: N/A   Occupational History  . Not on file.   Social History Main Topics  . Smoking status: Never Smoker   . Smokeless tobacco: Never Used  . Alcohol Use: No  . Drug Use: No  . Sexual Activity:  Not on file   Other Topics Concern  . Not on file   Social History Narrative   Diet: Always   Do you drink/eat things with caffeine? Dr. Malachi Bonds   Marital status: Divorced                              What year were you married? 2001-2005   Do you live in a house, apartment, assisted living, condo, trailer, etc)? House   Is it one or more stories? 1   How many persons live in your home? 2   Do you have any pets in your home? Dog   Current or past profession: Works for [employer] you exercise?  No                                                  Type & how often: No   Do you have a living will? No   Do  you have a DNR Form? No   Do you have a POA/HPOA forms? No     reports that she has never smoked. She has never used smokeless tobacco. She reports that she does not drink alcohol or use illicit drugs.  Family History  Problem Relation Age of Onset  . Anxiety disorder Mother   . Asthma Son   . Allergies Son    Family Status  Relation Status Death Age  . Mother Alive   . Brother Alive   . Son Alive     Immunization History  Administered Date(s) Administered  . Tdap 02/19/2012    Allergies  Allergen Reactions  . Amoxicillin Hives, Shortness Of Breath and Swelling  . Sulfonamide Derivatives Hives, Shortness Of Breath and Swelling  . Latex Rash    Medications: Patient's Medications  New Prescriptions   No medications on file  Previous Medications   ALPRAZOLAM (XANAX) 1 MG TABLET    Take 2 mg by mouth 3 (three) times daily as needed. anxiety   CETIRIZINE-PSEUDOEPHEDRINE (ZYRTEC-D) 5-120 MG PER TABLET    Take 1 tablet by mouth 2 (two) times daily. allergies   FLUOXETINE (PROZAC) 10 MG CAPSULE    Take 80 mg by mouth daily.    FLUTICASONE (FLONASE) 50 MCG/ACT NASAL SPRAY    Place 1 spray into both nostrils daily.   MULTIPLE VITAMINS-MINERALS (MULTIVITAMINS THER. W/MINERALS) TABS    Take 1 tablet by mouth daily.     SUMATRIPTAN (IMITREX) 100 MG TABLET    Take 100 mg by mouth  every 2 (two) hours as needed.     TIZANIDINE (ZANAFLEX) 4 MG TABLET    Take 4 mg by mouth at bedtime.  Modified Medications   No medications on file  Discontinued Medications   No medications on file    Review of Systems  Constitutional: Negative for fever, chills, diaphoresis, activity change, appetite change and fatigue.  HENT: Positive for dental problem and sinus pressure. Negative for ear pain and sore throat.        Ear pressure  Eyes: Negative for visual disturbance.  Respiratory: Negative for cough, chest tightness and shortness of breath.   Cardiovascular: Negative for chest pain, palpitations and leg swelling.  Gastrointestinal: Positive for abdominal pain and constipation. Negative for nausea, vomiting, diarrhea and blood in stool.  Genitourinary: Negative for dysuria.  Musculoskeletal: Positive for myalgias, back pain, joint swelling and arthralgias.  Allergic/Immunologic: Positive for environmental allergies.  Neurological: Positive for weakness and headaches. Negative for dizziness, tremors and numbness.  Psychiatric/Behavioral: Positive for dysphoric mood and agitation. Negative for sleep disturbance. The patient is nervous/anxious.     Filed Vitals:   01/27/15 0821  BP: 126/82  Pulse: 96  Temp: 97.7 F (36.5 C)  TempSrc: Oral  Resp: 18  Height: 5\' 5"  (1.651 m)  Weight: 216 lb 9.6 oz (98.249 kg)  SpO2: 96%   Body mass index is 36.04 kg/(m^2).  Physical Exam  Constitutional: She is oriented to person, place, and time. She appears well-developed and well-nourished.  HENT:  Mouth/Throat: Oropharynx is clear and moist. No oropharyngeal exudate.  TMs dull but no redness or bulging. Maxillary sinus TTP with boggy tissue texture changes. Oropharynx cobblestoning and red but no exudate.  Eyes: Pupils are equal, round, and reactive to light. No scleral icterus.  Neck: Neck supple. Carotid bruit is not present. No tracheal deviation present. No thyromegaly present.    Cardiovascular: Normal rate, regular rhythm, normal heart sounds and intact distal pulses.  Exam reveals no gallop and no friction rub.   No murmur heard. No LE edema b/l. no calf TTP.   Pulmonary/Chest: Effort normal and breath sounds normal. No stridor. No respiratory distress. She has no wheezes. She has no rales.  Abdominal: Soft. Bowel sounds are normal. She exhibits no distension and no mass. There is no hepatomegaly. There is no tenderness. There is no rebound and no guarding.  Musculoskeletal: She exhibits edema and tenderness.  Multiple fibromyalgia TPs  Lymphadenopathy:    She has no cervical adenopathy.  Neurological: She is alert and oriented to person, place, and time.  Skin: Skin is warm and dry. No rash noted.  Tattoos present  Psychiatric: Her behavior is normal. Judgment and thought content normal. Her mood appears anxious.     Labs reviewed: No visits with results within 3 Month(s) from this visit.  No results found.   Assessment/Plan    ICD-9-CM ICD-10-CM   1. Allergic rhinitis due to pollen - mildly exacerbated 477.0 J30.1   2. Chronic pain syndrome - stable 338.4 G89.4 Ambulatory referral to Pain Clinic  3. Fibromyalgia syndrome - stable 729.1 M79.7 Ambulatory referral to Pain Clinic  4. Iron deficiency anemia 280.9 D50.9   5. Major depressive disorder, recurrent episode, in partial remission - stable 296.35 F33.41   6. Generalized anxiety disorder - stable 300.02 F41.1    --Depo-medrol 80 mg IM x 1  --cont zyrtec D daily. Recommend prn nasal saline spray  --cont other meds as ordered  --keep appt with pain specialist until see new provider  --cont f/u with psych  --f/u in 1 month for CPE/pap. Fasting labs at appt  [facility]. Perlie Gold  [facility] [address] [place],  [address] [phone] Cell (Monday-Friday 8 AM - 5 PM) [phone] After 5 PM and follow prompts

## 2015-03-06 ENCOUNTER — Encounter: Payer: Self-pay | Admitting: Internal Medicine

## 2015-03-08 ENCOUNTER — Encounter: Payer: Self-pay | Admitting: Internal Medicine

## 2015-03-08 ENCOUNTER — Encounter: Payer: BLUE CROSS/BLUE SHIELD | Admitting: Internal Medicine

## 2015-03-08 DIAGNOSIS — Z0289 Encounter for other administrative examinations: Secondary | ICD-10-CM

## 2015-03-09 NOTE — Progress Notes (Signed)
This encounter was created in error - please disregard.

## 2015-03-16 ENCOUNTER — Telehealth: Payer: Self-pay | Admitting: Internal Medicine

## 2015-03-16 NOTE — Telephone Encounter (Signed)
FYI - Patient has not responded to multiple attempts to contact her by our office & preferred pain management to schedule an appointment.  Patient has been called several times and we also sent her a letter.

## 2015-03-16 NOTE — Telephone Encounter (Signed)
Noted  

## 2015-06-07 ENCOUNTER — Encounter: Payer: Self-pay | Admitting: Gastroenterology

## 2015-09-28 ENCOUNTER — Encounter: Payer: Self-pay | Admitting: Internal Medicine

## 2016-04-09 ENCOUNTER — Ambulatory Visit (HOSPITAL_COMMUNITY)
Admission: EM | Admit: 2016-04-09 | Discharge: 2016-04-09 | Disposition: A | Discharge status: Home / Self Care | Payer: BLUE CROSS/BLUE SHIELD | Attending: Family Medicine | Admitting: Family Medicine

## 2016-04-09 DIAGNOSIS — F339 Major depressive disorder, recurrent, unspecified: Secondary | ICD-10-CM | POA: Insufficient documentation

## 2016-04-09 DIAGNOSIS — Z880 Allergy status to penicillin: Secondary | ICD-10-CM | POA: Diagnosis not present

## 2016-04-09 DIAGNOSIS — J45909 Unspecified asthma, uncomplicated: Secondary | ICD-10-CM | POA: Insufficient documentation

## 2016-04-09 DIAGNOSIS — N898 Other specified noninflammatory disorders of vagina: Secondary | ICD-10-CM | POA: Diagnosis not present

## 2016-04-09 DIAGNOSIS — G5600 Carpal tunnel syndrome, unspecified upper limb: Secondary | ICD-10-CM | POA: Insufficient documentation

## 2016-04-09 DIAGNOSIS — M1990 Unspecified osteoarthritis, unspecified site: Secondary | ICD-10-CM | POA: Insufficient documentation

## 2016-04-09 DIAGNOSIS — N760 Acute vaginitis: Secondary | ICD-10-CM | POA: Diagnosis not present

## 2016-04-09 DIAGNOSIS — M797 Fibromyalgia: Secondary | ICD-10-CM | POA: Diagnosis not present

## 2016-04-09 NOTE — ED Provider Notes (Signed)
[place] [facility]    CSN: [id number] Arrival date & time: 04/09/16  [id number]  First Provider Contact:  First MD Initiated Contact with Patient 04/09/16 1928        History   Chief Complaint Chief Complaint  Patient presents with  . Vaginal Discharge    ? TAMPON STUCK    HPI Kathy Hickman is a 35 y.o. female.    Female GU Problem  This is a new problem. The current episode started yesterday (concern about tampon in vag.). The problem has been gradually worsening. Pertinent negatives include no abdominal pain.    Past Medical History:  Diagnosis Date  . Allergy   . Anxiety   . Arthritis    osteoarthritis  . Asthma   . Carpal tunnel syndrome   . Depression   . Fibromyalgia   . Pneumonia     Patient Active Problem List   Diagnosis Date Noted  . Chronic pain syndrome 01/27/2015  . Fibromyalgia syndrome 01/27/2015  . Major depression, recurrent ([place]) 01/27/2015  . Generalized anxiety disorder 01/27/2015  . Iron deficiency anemia 07/15/2011  . Influenza A 07/15/2011  . Hypoxemia requiring supplemental oxygen 07/14/2011  . Asthma 07/14/2011  . GASTRITIS 05/03/2009  . CERVICAL CANCER 04/27/2009  . ANXIETY 04/27/2009  . DEPRESSION 04/27/2009  . BACK PAIN, CHRONIC 04/27/2009  . NAUSEA WITH VOMITING 04/27/2009  . ABDOMINAL PAIN RIGHT UPPER QUADRANT 04/27/2009  . BLOOD IN STOOL, OCCULT 04/27/2009  . UTI'S, HX OF 04/27/2009  . BIPOLAR AFFECTIVE DISORDER 04/26/2009  . GERD 04/26/2009  . ALLERGY 04/26/2009    Past Surgical History:  Procedure Laterality Date  . CESAREAN SECTION  2002   Dr. Ruthann Cancer    OB History    No data available       Home Medications    Prior to Admission medications   Medication Sig Start Date End Date Taking? Authorizing Provider  oxyCODONE-acetaminophen (PERCOCET) 10-325 MG tablet Take 1 tablet by mouth every 4 (four) hours as needed for pain.   Yes Historical Provider, MD  ALPRAZolam Duanne Moron) 1 MG tablet Take 2 mg by mouth 3  (three) times daily as needed. anxiety    Historical Provider, MD  cetirizine-pseudoephedrine (ZYRTEC-D) 5-120 MG per tablet Take 1 tablet by mouth 2 (two) times daily. allergies    Historical Provider, MD  FLUoxetine (PROZAC) 10 MG capsule Take 80 mg by mouth daily.     Historical Provider, MD  fluticasone (FLONASE) 50 MCG/ACT nasal spray Place 1 spray into both nostrils daily.    Historical Provider, MD  Multiple Vitamins-Minerals (MULTIVITAMINS THER. W/MINERALS) TABS Take 1 tablet by mouth daily.      Historical Provider, MD  SUMAtriptan (IMITREX) 100 MG tablet Take 100 mg by mouth every 2 (two) hours as needed.      Historical Provider, MD  tiZANidine (ZANAFLEX) 4 MG tablet Take 4 mg by mouth at bedtime.    Historical Provider, MD    Family History Family History  Problem Relation Age of Onset  . Anxiety disorder Mother   . Asthma Son   . Allergies Son     Social History Social History  Substance Use Topics  . Smoking status: Never Smoker  . Smokeless tobacco: Never Used  . Alcohol use No     Allergies   Amoxicillin; Sulfonamide derivatives; and Latex   Review of Systems Review of Systems  Gastrointestinal: Negative.  Negative for abdominal pain.  Genitourinary: Positive for menstrual problem. Negative for dysuria, flank pain,  frequency, pelvic pain and urgency.  Musculoskeletal: Negative.   All other systems reviewed and are negative.    Physical Exam Triage Vital Signs ED Triage Vitals [04/09/16 1854]  Enc Vitals Group     BP 124/66     Pulse Rate 78     Resp 12     Temp 98.4 F (36.9 C)     Temp Source Oral     SpO2 100 %     Weight      Height      Head Circumference      Peak Flow      Pain Score      Pain Loc      Pain Edu?      Excl. in [place]?    No data found.   Updated Vital Signs BP 124/66 (BP Location: Left Arm)   Pulse 78   Temp 98.4 F (36.9 C) (Oral)   Resp 12   SpO2 100%   Visual Acuity Right Eye Distance:   Left Eye Distance:     Bilateral Distance:    Right Eye Near:   Left Eye Near:    Bilateral Near:     Physical Exam  Constitutional: She is oriented to person, place, and time. She appears well-developed and well-nourished. No distress.  Abdominal: Soft. Bowel sounds are normal. There is no tenderness.  Genitourinary: Vagina normal and uterus normal. No vaginal discharge found.  Genitourinary Comments: No fb in vag. Scant menstrual blood.  Neurological: She is alert and oriented to person, place, and time.  Nursing note and vitals reviewed.    UC Treatments / Results  Labs (all labs ordered are listed, but only abnormal results are displayed) Labs Reviewed - No data to display  EKG  EKG Interpretation None       Radiology No results found.  Procedures Procedures (including critical care time)  Medications Ordered in UC Medications - No data to display   Initial Impression / Assessment and Plan / UC Course  I have reviewed the triage vital signs and the nursing notes.  Pertinent labs & imaging results that were available during my care of the patient were reviewed by me and considered in my medical decision making (see chart for details).  Clinical Course      Final Clinical Impressions(s) / UC Diagnoses   Final diagnoses:  None    New Prescriptions New Prescriptions   No medications on file     Billy Fischer, MD 04/09/16 [phone]

## 2016-04-09 NOTE — Discharge Instructions (Signed)
We will call with test results and treat as indicated °

## 2016-04-09 NOTE — ED Triage Notes (Signed)
PT. STATED,  I think but Im not sure that I have a tampon stuck up me. I didn't have a period last month, and I just started and it looks like pieces of it coming out.  I've also reached in there and pulled pieces out it looks like.

## 2016-04-10 LAB — CERVICOVAGINAL ANCILLARY ONLY
Chlamydia: NEGATIVE
Neisseria Gonorrhea: NEGATIVE
Wet Prep (BD Affirm): NEGATIVE

## 2016-08-05 HISTORY — PX: CARPAL TUNNEL RELEASE: SHX101

## 2020-08-05 DIAGNOSIS — C50919 Malignant neoplasm of unspecified site of unspecified female breast: Secondary | ICD-10-CM

## 2020-08-05 HISTORY — DX: Malignant neoplasm of unspecified site of unspecified female breast: C50.919

## 2021-04-17 ENCOUNTER — Other Ambulatory Visit: Payer: Self-pay | Admitting: Radiology

## 2021-04-19 ENCOUNTER — Encounter: Payer: Self-pay | Admitting: Obstetrics and Gynecology

## 2021-04-20 ENCOUNTER — Telehealth: Payer: Self-pay | Admitting: Oncology

## 2021-04-20 NOTE — Telephone Encounter (Signed)
Spoke to patient to confirm upcoming breast clinic appointment for 9/21, emailed packet to patient

## 2021-04-23 ENCOUNTER — Encounter: Payer: Self-pay | Admitting: *Deleted

## 2021-04-23 DIAGNOSIS — D0511 Intraductal carcinoma in situ of right breast: Secondary | ICD-10-CM | POA: Insufficient documentation

## 2021-04-24 NOTE — Progress Notes (Signed)
[place]  Telephone:([phone]) [phone] Fax:(336) [phone]    ID: Kathy Hickman DOB: 06-08-1981  MR#: [id number]  HYI#:[id number]  Patient Care Team: Leanna Battles, MD as PCP - General (Internal Medicine) Mauro Kaufmann, RN as Oncology Nurse Navigator Rockwell Germany, RN as Oncology Nurse Navigator Erroll Luna, MD as Consulting Physician (General Surgery) Zettie Gootee, Virgie Dad, MD as Consulting Physician (Oncology) Kyung Rudd, MD as Consulting Physician (Radiation Oncology) Armandina Stammer, DO as Consulting Physician (Obstetrics and Gynecology) Chauncey Cruel, MD OTHER MD:  CHIEF COMPLAINT: noninvasive breast cancer, estrogen receptor positive  CURRENT TREATMENT: Awaiting definitive surgery   HISTORY OF CURRENT ILLNESS: "Kathy Hickman" had her first ever screening mammography on 03/21/2021 showing a possible abnormality in the right breast. She underwent right diagnostic mammography with tomography and right breast ultrasonography at [facility] on 04/11/2021 showing: breast density category C; 0.8 cm irregular mass and associated distortion in right breast at 10 o'clock; no axillary lymphadenopathy.  Accordingly on 04/17/2021 she proceeded to biopsy of the right breast area in question. 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 Prognostic indicators significant for: estrogen receptor, 85% positive and progesterone receptor, 10% positive, both with strong staining intensity.   Cancer Staging Ductal carcinoma in situ (DCIS) of right breast Staging form: Breast, AJCC 8th Edition - Clinical stage from 04/25/2021: Stage 0 (cTis (DCIS), cN0, cM0, ER+, PR+, HER2: Not Assessed) - Unsigned Stage prefix: Initial diagnosis Nuclear grade: G2 Laterality: Right Staged by: Pathologist and managing physician Stage used in treatment planning: Yes National  guidelines used in treatment planning: Yes Type of national guideline used in treatment planning: NCCN  The patient's subsequent history is as detailed below.   INTERVAL HISTORY: Kathy Hickman was evaluated in the multidisciplinary breast cancer clinic on 04/25/2021 accompanied by her mother. Her case was also presented at the multidisciplinary breast cancer conference on the same day. At that time a preliminary plan was proposed: Breast conserving surgery, adjuvant radiation, antiestrogens   REVIEW OF SYSTEMS: There were no specific symptoms leading to the original mammogram, which was routinely scheduled. On the provided questionnaire, Kathy Hickman reports fatigue that only minimally affects her activities, overall pain (has fibromyalgia) and back pain, sinus problems, dental problems, constipation, breast pain at biopsy site, some easy bruising, arm and hand numbness, and anxiety. The patient denies unusual headaches, visual changes, nausea, vomiting, stiff neck, dizziness, or gait imbalance. There has been no cough, phlegm production, or pleurisy, no chest pain or pressure, and no change in bladder habits. The patient denies fever, rash, bleeding, unexplained fatigue or unexplained weight loss. A detailed review of systems was otherwise entirely negative.   COVID 19 VACCINATION STATUS: not vaccinated as of September 2022; has not had COVID   PAST MEDICAL HISTORY: Past Medical History:  Diagnosis Date   Allergy    Anxiety    Arthritis    osteoarthritis   Asthma    Carpal tunnel syndrome    Depression    Fibromyalgia    Hypertension    Pneumonia     PAST SURGICAL HISTORY: Past Surgical History:  Procedure Laterality Date   CARPAL TUNNEL RELEASE     CESAREAN SECTION  08/05/2000   Dr. Ruthann Cancer    FAMILY HISTORY: Family History  Problem Relation Age of Onset   Anxiety disorder Mother    Pancreatic cancer Maternal Grandmother    Lung cancer Paternal Grandfather  Asthma Son     Allergies Son    Breast cancer Paternal Aunt    Colon cancer Paternal Aunt    Her father died at age 38 from alcohol, drug issues. Her mother is currently 61 years old (as of 04/2021). [name] has one brother (and no sisters). She reports lung cancer in her paternal grandfather in his 44's, breast cancer in a paternal aunt at age 70, colon cancer in a different paternal aunt at age 39, and pancreatic cancer in her maternal grandmother at age 105.   GYNECOLOGIC HISTORY:  No LMP recorded. Menarche: 40 years old Age at first live birth: 40 years old [place] P 1 LMP 04/07/2021 Contraceptive used "on and off for past 15 years" HRT n/a  Hysterectomy? no BSO? no   SOCIAL HISTORY: (updated 04/2021)  Kathy Hickman is currently working for [employer], where she has worked for the last 20 years. She is single (divorced). She lives at home with her mother, who cleans houses part-time. Son Kathy Hickman, age 93, works in Stage manager in [place] with his father. Keelia is not a Designer, fashion/clothing.    ADVANCED DIRECTIVES: not in place   HEALTH MAINTENANCE: Social History   Tobacco Use   Smoking status: Never   Smokeless tobacco: Never  Substance Use Topics   Alcohol use: No   Drug use: No     Colonoscopy: 04/2009 (Dr. Sharlett Iles)  PAP: 03/2021  Bone density: unsure   Allergies  Allergen Reactions   Amoxicillin Hives, Shortness Of Breath and Swelling   Sulfonamide Derivatives Hives, Shortness Of Breath and Swelling   Latex Rash    Current Outpatient Medications  Medication Sig Dispense Refill   ALPRAZolam (XANAX) 0.5 MG tablet Take 0.5 mg by mouth daily.     atenolol (TENORMIN) 50 MG tablet Take 50 mg by mouth daily.     cetirizine-pseudoephedrine (ZYRTEC-D) 5-120 MG per tablet Take 1 tablet by mouth daily. allergies     clomiPRAMINE (ANAFRANIL) 25 MG capsule TAKE 2 CAPSULES DAILY IN THE AM.     docusate sodium (COLACE) 100 MG capsule Take 1 capsule by mouth every other day.     ferrous  gluconate (FERGON) 240 (27 FE) MG tablet 1 tablet daily.     gabapentin (NEURONTIN) 300 MG capsule Take 2 capsules by mouth in the morning and at bedtime.     HYDROmorphone HCl ER 16 MG TB24 Take 1 tablet by mouth daily.     montelukast (SINGULAIR) 10 MG tablet Take 10 mg by mouth daily.     Multiple Vitamins-Minerals (MULTIVITAMINS THER. W/MINERALS) TABS Take 1 tablet by mouth daily.       oxyCODONE-acetaminophen (PERCOCET) 10-325 MG tablet Take 1 tablet by mouth every 4 (four) hours as needed for pain. Takes 5 x/day     tiZANidine (ZANAFLEX) 4 MG tablet Take 4 mg by mouth in the morning and at bedtime.     topiramate (TOPAMAX) 25 MG tablet Take 2 tablets by mouth daily.     FLUoxetine (PROZAC) 10 MG capsule Take 80 mg by mouth daily.      fluticasone (FLONASE) 50 MCG/ACT nasal spray Place 1 spray into both nostrils daily.     SUMAtriptan (IMITREX) 100 MG tablet Take 100 mg by mouth every 2 (two) hours as needed.       No current facility-administered medications for this visit.    OBJECTIVE: White woman who appears stated age  40:   04/25/21 0916  BP: 90/60  Pulse: 76  Resp: 18  Temp: 97.9 F (36.6 C)  SpO2: 98%     Body mass index is 37.92 kg/m.   Wt Readings from Last 3 Encounters:  04/25/21 227 lb 14.4 oz (103.4 kg)  01/27/15 216 lb 9.6 oz (98.2 kg)  07/14/11 197 lb 5 oz (89.5 kg)      ECOG FS:1 - Symptomatic but completely ambulatory  Ocular: Sclerae unicteric, pupils round and equal Ear-nose-throat: Wearing a mask Lymphatic: No cervical or supraclavicular adenopathy Lungs no rales or rhonchi Heart regular rate and rhythm Abd soft, nontender, positive bowel sounds MSK no focal spinal tenderness, no joint edema Neuro: non-focal, well-oriented, appropriate affect Breasts: The right breast is status post recent biopsy.  There is a mild ecchymosis.  There are no skin or nipple changes of concern.  The left breast and both axillae are benign.   LAB  RESULTS:  CMP     Component Value Date/Time   NA 140 04/25/2021 0828   K 4.6 04/25/2021 0828   CL 110 04/25/2021 0828   CO2 21 (L) 04/25/2021 0828   GLUCOSE 94 04/25/2021 0828   BUN 20 04/25/2021 0828   CREATININE 0.95 04/25/2021 0828   CALCIUM 9.1 04/25/2021 0828   PROT 7.2 04/25/2021 0828   ALBUMIN 4.3 04/25/2021 0828   AST 17 04/25/2021 0828   ALT 19 04/25/2021 0828   ALKPHOS 70 04/25/2021 0828   BILITOT 0.3 04/25/2021 0828   GFRNONAA >60 04/25/2021 0828   GFRAA >90 07/15/2011 0436    No results found for: TOTALPROTELP, ALBUMINELP, A1GS, A2GS, BETS, BETA2SER, GAMS, MSPIKE, SPEI  Lab Results  Component Value Date   WBC 7.4 04/25/2021   NEUTROABS 3.5 04/25/2021   HGB 12.9 04/25/2021   HCT 38.4 04/25/2021   MCV 87.1 04/25/2021   PLT 279 04/25/2021    No results found for: LABCA2  No components found for: ZTIWPY099  No results for input(s): INR in the last 168 hours.  No results found for: LABCA2  No results found for: IPJ825  No results found for: KNL976  No results found for: BHA193  No results found for: CA2729  No components found for: HGQUANT  No results found for: CEA1 / No results found for: CEA1   No results found for: AFPTUMOR  No results found for: CHROMOGRNA  No results found for: KPAFRELGTCHN, LAMBDASER, KAPLAMBRATIO (kappa/lambda light chains)  No results found for: HGBA, HGBA2QUANT, HGBFQUANT, HGBSQUAN (Hemoglobinopathy evaluation)   No results found for: LDH  Lab Results  Component Value Date   IRON <10 (L) 07/14/2011   TIBC Not calculated due to Iron <10. 07/14/2011   IRONPCTSAT Not calculated due to Iron <10. 07/14/2011   (Iron and TIBC)  Lab Results  Component Value Date   FERRITIN 116 07/14/2011    Urinalysis    Component Value Date/Time   COLORURINE YELLOW 03/17/2011 [place] 03/17/2011 1517   LABSPEC 1.009 03/17/2011 1517   PHURINE 6.5 03/17/2011 1517   GLUCOSEU NEGATIVE 03/17/2011 1517    HGBUR NEGATIVE 03/17/2011 1517   BILIRUBINUR NEGATIVE 03/17/2011 [place] 03/17/2011 1517   PROTEINUR NEGATIVE 03/17/2011 1517   UROBILINOGEN 0.2 03/17/2011 1517   NITRITE NEGATIVE 03/17/2011 1517   LEUKOCYTESUR NEGATIVE 03/17/2011 1517     STUDIES: No results found.   ELIGIBLE FOR AVAILABLE RESEARCH PROTOCOL: no  ASSESSMENT: 40 y.o. [place] woman status post a right breast biopsy on 04/17/2021 for ductal carcinoma in situ, grade 2, with possible microinvasion; estrogen and progesterone receptor positive  (  1) definitive surgery pending  (2) genetics testing pending  (3) adjuvant radiation   (4) to start tamoxifen at the completion of local treatment  PLAN: I met today with Stefannie to review her new diagnosis. Specifically we discussed the biology of her breast cancer, its diagnosis, staging, treatment  options and prognosis. Lamaria understands that in noninvasive ductal carcinoma, also called ductal carcinoma in situ ("DCIS") the breast cancer cells remain trapped in the ducts were they started. They cannot travel to a vital organ. For that reason these cancers in themselves are not life-threatening.  If the whole breast is removed then all the ducts are removed and since the cancer cells are trapped in the ducts, the cure rate with mastectomy for noninvasive breast cancer is approximately 99%. Nevertheless we recommend lumpectomy, because there is no survival advantage to mastectomy and because the cosmetic result is generally superior with breast conservation.  Since the patient is keeping her breasts, there will be some risk of recurrence. The recurrence can only be in the same breast since, again, the cells are trapped in the ducts. There is no connection from one breast to the other. The risk of local recurrence is cut by more than half with radiation, which is standard in this situation.  In estrogen receptor positive cancers like @FTNAME @'s,  anti-estrogens can also be considered. They will further reduce the risk of recurrence by one half. In addition anti-estrogens will lower the risk of a new breast cancer developing in either breast, also by one half. That risk otherwise approaches 1% per year.   Margreta Journey does qualify for genetics testing. In patients who carry a deleterious mutation [for example in a  BRCA gene], the risk of a new breast cancer developing in the future may be sufficiently great that the patient may choose bilateral mastectomies. However if she wishes to keep her breasts in that situation it is safe to do so. That would require intensified screening, which generally means not only yearly mammography but a yearly breast MRI as well.   Quite aside from breast cancer issues she has expressed an interest in "stopping my periods".  We can discuss that at her return visit as well.  Drea has a good understanding of the overall plan. She agrees with it. She knows the goal of treatment in her case is cure. She will call with any problems that may develop before her next visit here.  Total encounter time 55 minutes.Sarajane Jews C. Lacrecia Delval, MD 04/25/2021 11:49 AM Medical Oncology and Hematology [facility] [place], [place] [address] Tel. [phone]    Fax. [phone]   This document serves as a record of services personally performed by Lurline Del, MD. It was created on his behalf by Wilburn Mylar, a trained medical scribe. The creation of this record is based on the scribe's personal observations and the provider's statements to them.   I, Lurline Del MD, have reviewed the above documentation for accuracy and completeness, and I agree with the above.    *Total Encounter Time as defined by the Centers for Medicare and Medicaid Services includes, in addition to the face-to-face time of a patient visit (documented in the note above) non-face-to-face time: obtaining and  reviewing outside history, ordering and reviewing medications, tests or procedures, care coordination (communications with other health care professionals or caregivers) and documentation in the medical record.

## 2021-04-25 ENCOUNTER — Encounter: Payer: Self-pay | Admitting: *Deleted

## 2021-04-25 ENCOUNTER — Ambulatory Visit: Payer: BC Managed Care – PPO | Admitting: Physical Therapy

## 2021-04-25 ENCOUNTER — Other Ambulatory Visit: Payer: Self-pay | Admitting: *Deleted

## 2021-04-25 ENCOUNTER — Encounter: Payer: Self-pay | Admitting: Genetic Counselor

## 2021-04-25 ENCOUNTER — Inpatient Hospital Stay (HOSPITAL_BASED_OUTPATIENT_CLINIC_OR_DEPARTMENT_OTHER): Payer: BC Managed Care – PPO | Admitting: Oncology

## 2021-04-25 ENCOUNTER — Inpatient Hospital Stay (HOSPITAL_BASED_OUTPATIENT_CLINIC_OR_DEPARTMENT_OTHER): Payer: BC Managed Care – PPO | Admitting: Genetic Counselor

## 2021-04-25 ENCOUNTER — Other Ambulatory Visit: Payer: Self-pay

## 2021-04-25 ENCOUNTER — Inpatient Hospital Stay: Payer: BC Managed Care – PPO | Attending: Oncology

## 2021-04-25 ENCOUNTER — Encounter: Payer: Self-pay | Admitting: General Practice

## 2021-04-25 ENCOUNTER — Ambulatory Visit: Payer: Self-pay | Admitting: Surgery

## 2021-04-25 ENCOUNTER — Ambulatory Visit
Admission: RE | Admit: 2021-04-25 | Discharge: 2021-04-25 | Disposition: A | Discharge status: Home / Self Care | Payer: BC Managed Care – PPO | Source: Ambulatory Visit | Attending: Radiation Oncology | Admitting: Radiation Oncology

## 2021-04-25 VITALS — BP 90/60 | HR 76 | Temp 97.9°F | Resp 18 | Ht 65.0 in | Wt 227.9 lb

## 2021-04-25 DIAGNOSIS — D0511 Intraductal carcinoma in situ of right breast: Secondary | ICD-10-CM | POA: Diagnosis not present

## 2021-04-25 DIAGNOSIS — Z800 Family history of malignant neoplasm of digestive organs: Secondary | ICD-10-CM

## 2021-04-25 DIAGNOSIS — Z803 Family history of malignant neoplasm of breast: Secondary | ICD-10-CM | POA: Diagnosis not present

## 2021-04-25 DIAGNOSIS — Z79899 Other long term (current) drug therapy: Secondary | ICD-10-CM | POA: Insufficient documentation

## 2021-04-25 LAB — CBC WITH DIFFERENTIAL (CANCER CENTER ONLY)
Abs Immature Granulocytes: 0 10*3/uL (ref 0.00–0.07)
Basophils Absolute: 0.1 10*3/uL (ref 0.0–0.1)
Basophils Relative: 1 %
Eosinophils Absolute: 0.6 10*3/uL — ABNORMAL HIGH (ref 0.0–0.5)
Eosinophils Relative: 8 %
HCT: 38.4 % (ref 36.0–46.0)
Hemoglobin: 12.9 g/dL (ref 12.0–15.0)
Immature Granulocytes: 0 %
Lymphocytes Relative: 37 %
Lymphs Abs: 2.8 10*3/uL (ref 0.7–4.0)
MCH: 29.3 pg (ref 26.0–34.0)
MCHC: 33.6 g/dL (ref 30.0–36.0)
MCV: 87.1 fL (ref 80.0–100.0)
Monocytes Absolute: 0.5 10*3/uL (ref 0.1–1.0)
Monocytes Relative: 7 %
Neutro Abs: 3.5 10*3/uL (ref 1.7–7.7)
Neutrophils Relative %: 47 %
Platelet Count: 279 10*3/uL (ref 150–400)
RBC: 4.41 MIL/uL (ref 3.87–5.11)
RDW: 13.4 % (ref 11.5–15.5)
WBC Count: 7.4 10*3/uL (ref 4.0–10.5)
nRBC: 0 % (ref 0.0–0.2)

## 2021-04-25 LAB — CMP (CANCER CENTER ONLY)
ALT: 19 U/L (ref 0–44)
AST: 17 U/L (ref 15–41)
Albumin: 4.3 g/dL (ref 3.5–5.0)
Alkaline Phosphatase: 70 U/L (ref 38–126)
Anion gap: 9 (ref 5–15)
BUN: 20 mg/dL (ref 6–20)
CO2: 21 mmol/L — ABNORMAL LOW (ref 22–32)
Calcium: 9.1 mg/dL (ref 8.9–10.3)
Chloride: 110 mmol/L (ref 98–111)
Creatinine: 0.95 mg/dL (ref 0.44–1.00)
GFR, Estimated: >60 mL/min (ref >60)
Glucose, Bld: 94 mg/dL (ref 70–99)
Potassium: 4.6 mmol/L (ref 3.5–5.1)
Sodium: 140 mmol/L (ref 135–145)
Total Bilirubin: 0.3 mg/dL (ref 0.3–1.2)
Total Protein: 7.2 g/dL (ref 6.5–8.1)

## 2021-04-25 LAB — GENETIC SCREENING ORDER

## 2021-04-25 NOTE — Progress Notes (Signed)
REFERRING PROVIDER: Lurline Del, MD [place], [place] [address]  PRIMARY PROVIDER:  Leanna Battles, MD  PRIMARY REASON FOR VISIT:  1. Ductal carcinoma in situ (DCIS) of right breast   2. Family history of breast cancer   3. Family history of pancreatic cancer   4. Family history of colon cancer     HISTORY OF PRESENT ILLNESS:   Ms. Fuquay, a 40 y.o. female, was seen for a [place] cancer genetics consultation during the breast multidisciplinary clinic at the request of Dr. Jana Hakim due to a personal and family history of cancer.  Ms. Schiano presents to clinic today to discuss the possibility of a hereditary predisposition to cancer, to discuss genetic testing, and to further clarify her future cancer risks, as well as potential cancer risks for family members.   CANCER HISTORY:  In September 2022, at the age of 9, Ms. Tallent was diagnosed with DCIS of the right breast (ER/PR positive). The treatment plan is pending.   RISK FACTORS:  Menarche was at age 32.  First live birth at age 63.  OCP use "on and off for the past 15 years" Ovaries intact: yes.  Hysterectomy: no.  Menopausal status: premenopausal.  HRT use: 0 years. Mammogram within the last year: yes. Colonoscopy in 2010, no polyps were identified Any excessive radiation exposure in the past: no  Past Medical History:  Diagnosis Date   Allergy    Anxiety    Arthritis    osteoarthritis   Asthma    Carpal tunnel syndrome    Depression    Fibromyalgia    Hypertension    Pneumonia     Past Surgical History:  Procedure Laterality Date   CARPAL TUNNEL RELEASE     CESAREAN SECTION  08/05/2000   Dr. Ruthann Cancer    Social History   Socioeconomic History   Marital status: Divorced    Spouse name: Not on file   Number of children: Not on file   Years of education: Not on file   Highest education level: Not on file  Occupational History   Not on file  Tobacco Use   Smoking status: Never    Smokeless tobacco: Never  Substance and Sexual Activity   Alcohol use: No   Drug use: No   Sexual activity: Not on file  Other Topics Concern   Not on file  Social History Narrative   Diet: Always   Do you drink/eat things with caffeine? Dr. Malachi Bonds   Marital status: Divorced                              What year were you married? 2001-2005   Do you live in a house, apartment, assisted living, condo, trailer, etc)? House   Is it one or more stories? 1   How many persons live in your home? 2   Do you have any pets in your home? Dog   Current or past profession: Works for [employer] you exercise?  No                                                  Type & how often: No   Do you have a living will? No   Do you have a DNR Form? No  Do you have a POA/HPOA forms? No   Social Determinants of Radio broadcast assistant Strain: Not on file  Food Insecurity: Not on file  Transportation Needs: Not on file  Physical Activity: Not on file  Stress: Not on file  Social Connections: Not on file     FAMILY HISTORY:  We obtained a detailed, 4-generation family history.  Significant diagnoses are listed below: Family History  Problem Relation Age of Onset   Pancreatic cancer Maternal Grandmother 91   Lung cancer Paternal Grandfather 48s   Breast cancer Paternal Aunt 92 (contralateral)   Colon cancer Paternal Aunt 22      Ms. Knabe's maternal grandmother was diagnosed with pancreatic cancer at 74 and died just a few months later. Her maternal grandmother's mother was diagnosed with breast cancer at an unknown age, she is deceased. Her paternal aunt was diagnosed with colon cancer at 59. Another paternal aunt was diagnosed with contralateral breast cancer at 15, she had a double mastectomy and died due to metastasis at 48. Ms. Sabina paternal grandfather was diagnosed with lung cancer in his 30s, he smoked and is deceased. Ms. Radin is unaware of previous family history of genetic testing  for hereditary cancer risks.   GENETIC COUNSELING ASSESSMENT: Ms. Shimamoto is a 40 y.o. female with a personal and family history of cancer which is somewhat suggestive of a hereditary cancer syndrome and predisposition to cancer given her young age at diagnosis. We, therefore, discussed and recommended the following at today's visit.   DISCUSSION:  We discussed that 5 - 10% of cancer is hereditary, with most cases of hereditary breast cancer associated with mutations in BRCA1/2.  There are other genes that can be associated with hereditary breast cancer syndromes. Type of cancer risk and level of risk are gene-specific. We discussed that testing is beneficial for several reasons including knowing how to follow individuals after completing their treatment, identifying whether potential treatment options would be beneficial, and understanding if other family members could be at risk for cancer and allowing them to undergo genetic testing.   We reviewed the characteristics, features and inheritance patterns of hereditary cancer syndromes. We also discussed genetic testing, including the appropriate family members to test, the process of testing, insurance coverage and turn-around-time for results. We discussed the implications of a negative, positive and/or variant of uncertain significant result. In order to get genetic test results in a timely manner so that Ms. Overall can use these genetic test results for surgical decisions, we recommended Ms. Hiney pursue genetic testing for the BRCAplus. Once complete, we recommend Ms. Stroder pursue reflex genetic testing to a more comprehensive gene panel.   Ms. Scaletta  was offered a common hereditary cancer panel (47 genes) and an expanded pan-cancer panel (77 genes). Ms. Dull was informed of the benefits and limitations of each panel, including that expanded pan-cancer panels contain genes that do not have clear management guidelines at this point in time.  We also  discussed that as the number of genes included on a panel increases, the chances of variants of uncertain significance increases.  After considering the benefits and limitations of each gene panel, Ms. Betzler elected to have [facility] CustomNext+RNA panel (47 genes).  Based on Ms. Goering's personal and family history of cancer, she meets medical criteria for genetic testing. Despite that she meets criteria, she may still have an out of pocket cost. We discussed that if her out of pocket cost for testing is over $100,  the laboratory should contact them to discuss self-pay prices, patient pay assistance programs, if applicable, and other billing options.   PLAN: After considering the risks, benefits, and limitations, Ms. Meany provided informed consent to pursue genetic testing and the blood sample was sent to [facility] for analysis of the BRCAplus with reflex to CustomNext+RNA (47 genes total). Results should be available within approximately 1-2 weeks' time, at which point they will be disclosed by telephone to Ms. Ojeda, as will any additional recommendations warranted by these results. Ms. Crass will receive a summary of her genetic counseling visit and a copy of her results once available. This information will also be available in Epic.   Ms. Kishbaugh questions were answered to her satisfaction today. Our contact information was provided should additional questions or concerns arise. Thank you for the referral and allowing [place] to share in the care of your patient.   Lucille Passy, MS, [facility] Genetic Counselor [email or web address] (P) [phone]  The patient was seen for a total of 20 minutes in face-to-face genetic counseling. The patient brought her mother, Butch Penny. Drs. Magrinat, Lindi Adie and/or Burr Medico were available to discuss this case as needed.  _______________________________________________________________________ For Office Staff:  Number of people involved in session: 2 Was an  Intern/ student involved with case: no

## 2021-04-25 NOTE — Progress Notes (Signed)
[place] Work  Initial Assessment  Assessment performed by [employer] Intern Rosary Lively. Notes entered by Edwyna Shell because Intern does not have full access to Epic at this time. Kathy Hickman is a 40 y.o. year old female accompanied by patient and mother. Clinical Social Work was referred by [facility] for assessment of psychosocial needs.   SDOH (Social Determinants of Health) assessments performed: Yes SDOH Interventions    Flowsheet Row Most Recent Value  SDOH Interventions   Food Insecurity Interventions Intervention Not Indicated  Financial Strain Interventions Financial Counselor  Housing Interventions Intervention Not Indicated  Transportation Interventions Intervention Not Indicated       Distress Screen completed: Yes ONCBCN DISTRESS SCREENING 04/25/2021  Screening Type Initial Screening  Distress experienced in past week (1-10) 8  Practical problem type Insurance;Work/school  Emotional problem type Adjusting to illness;Boredom  Information Concerns Type Lack of info about diagnosis;Lack of info about treatment  Physical Problem type Pain;Sleep/insomnia;Constipation/diarrhea;Tingling hands/feet      Family/Social Information:  Housing Arrangement: unsure if patient lives alone. Family members/support persons in your life? Family Transportation concerns: no  Employment: Working full time. Income source: Employment Financial concerns: Yes, due to illness and/or loss of work during treatment Type of concern:  Concern that future loss of income due to treatment could make it challenging to pay for basic expenses. Food access concerns: no Religious or spiritual practice: not addressed Medication Concerns: no  Services Currently in place:  N/A  Coping/ Adjustment to diagnosis: Patient understands treatment plan and what happens next? yes Concerns about diagnosis and/or treatment:  Losing hours at work. Patient reported stressors: Insurance and Work/  school Patient enjoys  did not discuss at this time Current coping skills/ strengths: Capable of independent living  and Supportive family/friends     SUMMARY: Current SDOH Barriers:  None at this time.   Interventions: Discussed common feeling and emotions when being diagnosed with cancer, and the importance of support during treatment Informed patient of the support team roles and support services at [facility] Provided CSW contact information and encouraged patient to call with any questions or concerns Referred patient to financial advocates.   Follow Up Plan: Patient will contact CSW with any support or resource needs Patient verbalizes understanding of plan: Yes   Rosary Lively, [place] , LCSW

## 2021-04-25 NOTE — Progress Notes (Signed)
Radiation Oncology         [phone] ________________________________  Name: Kathy Hickman        MRN: [id number]  Date of Service: 04/25/2021 DOB: 1980/10/21  [id number], Kathy Quince, MD  Kathy Luna, MD     REFERRING PHYSICIAN: Erroll Luna, MD   DIAGNOSIS: The encounter diagnosis was Ductal carcinoma in situ (DCIS) of right breast.   HISTORY OF PRESENT ILLNESS: Kathy Hickman is a 40 y.o. female seen in the multidisciplinary breast clinic for a new diagnosis of right breast Hickman. The patient was noted to have a screening detected mass and distortion. Diagnostic imaging showed a 2 cm area of distortion and a 7 mm mass, and by ultrasound the mass was noted at 10:00 measuring up to 8 mm, and her axilla was negative for adenopathy. A biopsy on 04/17/21 showed an intermediate grade DCIS with necrosis in a CSL, microinvasion was not excluded. Her tumor was ER/PR positive, and she's seen today to discuss treatment recommendations for her Hickman.    PREVIOUS RADIATION THERAPY: No   PAST MEDICAL HISTORY:  Past Medical History:  Diagnosis Date   Allergy    Anxiety    Arthritis    osteoarthritis   Asthma    Carpal tunnel syndrome    Depression    Fibromyalgia    Pneumonia        PAST SURGICAL HISTORY: Past Surgical History:  Procedure Laterality Date   CESAREAN SECTION  2002   Dr. Ruthann Hickman     FAMILY HISTORY:  Family History  Problem Relation Age of Onset   Anxiety disorder Mother    Asthma Son    Allergies Son      SOCIAL HISTORY:  reports that she has never smoked. She has never used smokeless tobacco. She reports that she does not drink alcohol and does not use drugs. The patient is divorced and lives in [place]. She works for [employer] in [place] and works from 2 am- 9 am M-Sat. She is accompanied by her mother.   ALLERGIES: Amoxicillin, Sulfonamide derivatives, and Latex   MEDICATIONS:  Current Outpatient Medications  Medication Sig Dispense  Refill   ALPRAZolam (XANAX) 1 MG tablet Take 2 mg by mouth 3 (three) times daily as needed. anxiety     cetirizine-pseudoephedrine (ZYRTEC-D) 5-120 MG per tablet Take 1 tablet by mouth 2 (two) times daily. allergies     FLUoxetine (PROZAC) 10 MG capsule Take 80 mg by mouth daily.      fluticasone (FLONASE) 50 MCG/ACT nasal spray Place 1 spray into both nostrils daily.     Multiple Vitamins-Minerals (MULTIVITAMINS THER. W/MINERALS) TABS Take 1 tablet by mouth daily.       oxyCODONE-acetaminophen (PERCOCET) 10-325 MG tablet Take 1 tablet by mouth every 4 (four) hours as needed for pain.     SUMAtriptan (IMITREX) 100 MG tablet Take 100 mg by mouth every 2 (two) hours as needed.       tiZANidine (ZANAFLEX) 4 MG tablet Take 4 mg by mouth at bedtime.     No current facility-administered medications for this encounter.     REVIEW OF SYSTEMS: On review of systems, the patient reports that she is doing okay. She suffers with chronic pain from fibromyalgia. She has fatigue as well and has constipation. She has soreness from her biopsy site. No other complaints      PHYSICAL EXAM:  Wt Readings from Last 3 Encounters:  01/27/15 216 lb 9.6 oz (98.2 kg)  07/14/11 197 lb  5 oz (89.5 kg)   Temp Readings from Last 3 Encounters:  04/09/16 98.4 F (36.9 C) (Oral)  01/27/15 97.7 F (36.5 C) (Oral)  02/18/12 98.4 F (36.9 C) (Oral)   BP Readings from Last 3 Encounters:  04/09/16 124/66  01/27/15 126/82  02/18/12 112/60   Pulse Readings from Last 3 Encounters:  04/09/16 78  01/27/15 96  02/18/12 72    In general this is a well appearing caucasian female in no acute distress. She's alert and oriented x4 and appropriate throughout the examination. Cardiopulmonary assessment is negative for acute distress and she exhibits normal effort. Bilateral breast exam is deferred.    ECOG = 1  0 - Asymptomatic (Fully active, able to carry on all predisease activities without restriction)  1 -  Symptomatic but completely ambulatory (Restricted in physically strenuous activity but ambulatory and able to carry out work of a light or sedentary nature. For example, light housework, office work)  2 - Symptomatic, <50% in bed during the day (Ambulatory and capable of all self care but unable to carry out any work activities. Up and about more than 50% of waking hours)  3 - Symptomatic, >50% in bed, but not bedbound (Capable of only limited self-care, confined to bed or chair 50% or more of waking hours)  4 - Bedbound (Completely disabled. Cannot carry on any self-care. Totally confined to bed or chair)  5 - Death   Eustace Pen MM, Creech RH, Tormey DC, et al. [phone]). "Toxicity and response criteria of the [facility]". [place] Oncol. 5 (6): 649-55    LABORATORY DATA:  Lab Results  Component Value Date   WBC 11.9 (H) 07/15/2011   HGB 9.8 (L) 07/15/2011   HCT 29.8 (L) 07/15/2011   MCV 83.5 07/15/2011   PLT 221 07/15/2011   Lab Results  Component Value Date   NA 134 (L) 07/15/2011   K 3.7 07/15/2011   CL 103 07/15/2011   CO2 23 07/15/2011   Lab Results  Component Value Date   ALT 41 (H) 07/15/2011   AST 38 (H) 07/15/2011   ALKPHOS 77 07/15/2011   BILITOT 0.1 (L) 07/15/2011      RADIOGRAPHY: No results found.     IMPRESSION/PLAN: 1. 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 Dr. Lisbeth Hickman discusses the pathology findings and reviews the nature of early stage breast disease. The consensus from the breast conference includes breast conservation with lumpectomy. Dr. Lisbeth Hickman discusses the rationale for external radiotherapy to the breast  to reduce risks of local recurrence followed by antiestrogen therapy. We discussed the risks, benefits, short, and long term effects of radiotherapy, as well as the curative intent, and the patient is interested in proceeding. Dr. Lisbeth Hickman discusses the delivery and logistics of radiotherapy and anticipates a  course of 6 1/2 weeks of radiotherapy. We will see her back a few weeks after surgery to discuss the simulation process and anticipate we starting radiotherapy about 4-6 weeks after surgery.  2. Possible genetic predisposition to malignancy. The patient is a candidate for genetic testing given her personal and family history. She was offered referral and is interested in meeting with them today. 3. Contraceptive Counseling. The patient is not sexually active but if she becomes active will need pregnancy testing prior to radiotherapy.  In a visit lasting 60 minutes, greater than 50% of the time was spent face to face reviewing her case, as well as in preparation of, discussing, and coordinating the  patient's care.  The above documentation reflects my direct findings during this shared patient visit. Please see the separate note by Dr. Lisbeth Hickman on this date for the remainder of the patient's plan of care.    Carola Rhine, [facility]    **Disclaimer: This note was dictated with voice recognition software. Similar sounding words can inadvertently be transcribed and this note may contain transcription errors which may not have been corrected upon publication of note.**

## 2021-04-26 ENCOUNTER — Other Ambulatory Visit: Payer: Self-pay | Admitting: *Deleted

## 2021-04-26 DIAGNOSIS — D0511 Intraductal carcinoma in situ of right breast: Secondary | ICD-10-CM

## 2021-04-27 ENCOUNTER — Telehealth: Payer: Self-pay | Admitting: Radiation Oncology

## 2021-04-27 NOTE — Telephone Encounter (Signed)
LVM to schedule consult with Dr. Moody ?

## 2021-04-30 ENCOUNTER — Telehealth: Payer: Self-pay | Admitting: Radiation Oncology

## 2021-04-30 NOTE — Telephone Encounter (Signed)
LVM to schedule consult

## 2021-05-01 ENCOUNTER — Encounter: Payer: Self-pay | Admitting: *Deleted

## 2021-05-01 NOTE — Progress Notes (Signed)
[place] CSW Progress Notes  SW Intern called patient to follow up from [facility] on 04/25/21. Intern left a message and asked pt to call back in order to share possible financial resources, as discussed at clinic.  Rosary Lively, BSW Intern Supervised by Gwinda Maine, LCSW

## 2021-05-03 ENCOUNTER — Inpatient Hospital Stay
Admission: RE | Admit: 2021-05-03 | Discharge: 2021-05-03 | Disposition: A | Discharge status: Home / Self Care | Payer: Self-pay | Source: Ambulatory Visit | Attending: Radiation Oncology | Admitting: Radiation Oncology

## 2021-05-03 ENCOUNTER — Ambulatory Visit
Admission: RE | Admit: 2021-05-03 | Discharge: 2021-05-03 | Disposition: A | Discharge status: Home / Self Care | Payer: Self-pay | Source: Ambulatory Visit | Attending: Radiation Oncology | Admitting: Radiation Oncology

## 2021-05-03 ENCOUNTER — Encounter: Payer: Self-pay | Admitting: *Deleted

## 2021-05-03 ENCOUNTER — Other Ambulatory Visit: Payer: Self-pay | Admitting: Radiation Oncology

## 2021-05-03 ENCOUNTER — Encounter (HOSPITAL_BASED_OUTPATIENT_CLINIC_OR_DEPARTMENT_OTHER): Payer: Self-pay | Admitting: Surgery

## 2021-05-03 ENCOUNTER — Other Ambulatory Visit: Payer: Self-pay

## 2021-05-03 ENCOUNTER — Telehealth: Payer: Self-pay | Admitting: *Deleted

## 2021-05-03 ENCOUNTER — Telehealth: Payer: Self-pay | Admitting: Genetic Counselor

## 2021-05-03 ENCOUNTER — Encounter: Payer: Self-pay | Admitting: Genetic Counselor

## 2021-05-03 DIAGNOSIS — D0511 Intraductal carcinoma in situ of right breast: Secondary | ICD-10-CM

## 2021-05-03 DIAGNOSIS — Z1379 Encounter for other screening for genetic and chromosomal anomalies: Secondary | ICD-10-CM | POA: Insufficient documentation

## 2021-05-03 NOTE — Telephone Encounter (Signed)
I contacted Ms. Nicolini to discuss her first set of genetic test results. The BRCAplus STAT panel analyzed 8 genes and no pathogenic variants were identified. Therefore, the results were negative. I will contact Ms. Dortch again once the remaining 39 genes are available.   Lucille Passy, MS, [facility] Genetic Counselor [email or web address] (P) [phone]

## 2021-05-03 NOTE — Telephone Encounter (Signed)
Left message on identified voicemail to follow up from [facility] 9/21 and assess navigation needs.

## 2021-05-07 ENCOUNTER — Other Ambulatory Visit: Payer: Self-pay | Admitting: Surgery

## 2021-05-07 ENCOUNTER — Telehealth: Payer: Self-pay | Admitting: Genetic Counselor

## 2021-05-07 ENCOUNTER — Ambulatory Visit: Payer: Self-pay | Admitting: Genetic Counselor

## 2021-05-07 DIAGNOSIS — D0511 Intraductal carcinoma in situ of right breast: Secondary | ICD-10-CM

## 2021-05-07 NOTE — Telephone Encounter (Signed)
I contacted Ms. Stidd to discuss her remaining genetic testing results. No pathogenic variants were identified in the 47 genes analyzed.   The test report has been scanned into EPIC and is located under the Molecular Pathology section of the Results Review tab.  A portion of the result report is included below for reference. Detailed clinic note to follow.  Lucille Passy, MS, [facility] Genetic Counselor [email or web address] (P) [phone]

## 2021-05-07 NOTE — Progress Notes (Signed)
HPI:   Kathy Hickman was previously seen in the [place] clinic due to a personal and family history of cancer and concerns regarding a hereditary predisposition to cancer. Please refer to our prior cancer genetics clinic note for more information regarding our discussion, assessment and recommendations, at the time. Kathy Hickman recent genetic test results were disclosed to her, as were recommendations warranted by these results. These results and recommendations are discussed in more detail below.  CANCER HISTORY:  In September 2022, at the age of 40, Kathy Hickman was diagnosed with DCIS of the right breast (ER/PR positive). The treatment plan is pending.   FAMILY HISTORY:  We obtained a detailed, 4-generation family history.  Significant diagnoses are listed below:      Family History  Problem Relation Age of Onset   Pancreatic cancer Maternal Grandmother 91   Lung cancer Paternal Grandfather 49s   Breast cancer Paternal Aunt 91 (contralateral)   Colon cancer Paternal Aunt 44         Kathy Hickman's maternal grandmother was diagnosed with pancreatic cancer at 12 and died just a few months later. Her maternal grandmother's mother was diagnosed with breast cancer at an unknown age, she is deceased. Her paternal aunt was diagnosed with colon cancer at 62. Another paternal aunt was diagnosed with contralateral breast cancer at 38, she had a double mastectomy and died due to metastasis at 74. Kathy Hickman paternal grandfather was diagnosed with lung cancer in his 75s, he smoked and is deceased. Kathy Hickman is unaware of previous family history of genetic testing for hereditary cancer risks.    GENETIC TEST RESULTS:  The [facility] CustomNext+RNA Panel found no pathogenic mutations.  The CustomNext-Cancer+RNAinsight panel offered by [facility] includes sequencing and rearrangement analysis for the following 47 genes:  APC, ATM, AXIN2, BARD1, BMPR1A, BRCA1, BRCA2, BRIP1, CDH1, CDK4, CDKN2A,  CHEK2, DICER1, EPCAM, GREM1, HOXB13, MEN1, MLH1, MSH2, MSH3, MSH6, MUTYH, NBN, NF1, NF2, NTHL1, PALB2, PMS2, POLD1, POLE, PTEN, RAD51C, RAD51D, RECQL, RET, SDHA, SDHAF2, SDHB, SDHC, SDHD, SMAD4, SMARCA4, STK11, TP53, TSC1, TSC2, and VHL.  RNA data is routinely analyzed for use in variant interpretation for all genes.   The test report has been scanned into EPIC and is located under the Molecular Pathology section of the Results Review tab.  A portion of the result report is included below for reference. Genetic testing reported out on 05/05/2021.          Even though a pathogenic variant was not identified, possible explanations for the cancer in the family may include: There may be no hereditary risk for cancer in the family. The cancers in Kathy Hickman and her family may be due to other genetic or environmental factors. There may be a gene mutation in one of these genes that current testing methods cannot detect, but that chance is small. There could be another gene that has not yet been discovered, or that we have not yet tested, that is responsible for the cancer diagnoses in the family.  It is also possible there is a hereditary cause for the cancer in the family that Kathy Hickman did not inherit.  Therefore, it is important to remain in touch with cancer genetics in the future so that we can continue to offer Kathy Hickman the most up to date genetic testing.   ADDITIONAL GENETIC TESTING:  We discussed with Kathy Hickman that her genetic testing was fairly extensive.  If there are genes identified to increase cancer risk  that can be analyzed in the future, we would be happy to discuss and coordinate this testing at that time.    CANCER SCREENING RECOMMENDATIONS:  Kathy Hickman test result is considered negative (normal).  This means that we have not identified a hereditary cause for her personal and family history of cancer at this time.   An individual's cancer risk and medical management are not  determined by genetic test results alone. Overall cancer risk assessment incorporates additional factors, including personal medical history, family history, and any available genetic information that may result in a personalized plan for cancer prevention and surveillance. Therefore, it is recommended she continue to follow the cancer management and screening guidelines provided by her oncology and primary healthcare provider.  RECOMMENDATIONS FOR FAMILY MEMBERS:   Since she did not inherit a mutation in a cancer predisposition gene included on this panel, her son could not have inherited a mutation from her in one of these genes. Individuals in this family might be at some increased risk of developing cancer, over the general population risk, due to the family history of cancer.  We recommend women in this family have a yearly mammogram beginning at age 36, or 65 years younger than the earliest onset of cancer, an annual clinical breast exam, and perform monthly breast self-exams.  Other members of the family may still carry a pathogenic variant in one of these genes that Kathy Hickman did not inherit. Based on the family history, we recommend her mother have genetic counseling and testing.   FOLLOW-UP:  Cancer genetics is a rapidly advancing field and it is possible that new genetic tests will be appropriate for her and/or her family members in the future. We encouraged her to remain in contact with cancer genetics on an annual basis so we can update her personal and family histories and let her know of advances in cancer genetics that may benefit this family.   Our contact number was provided. Kathy Hickman questions were answered to her satisfaction, and she knows she is welcome to call us at anytime with additional questions or concerns.   Lucille Passy, MS, [facility] Genetic Counselor [email or web address] (P) [phone]

## 2021-05-09 ENCOUNTER — Encounter (HOSPITAL_BASED_OUTPATIENT_CLINIC_OR_DEPARTMENT_OTHER)
Admission: RE | Admit: 2021-05-09 | Discharge: 2021-05-09 | Disposition: A | Discharge status: Home / Self Care | Payer: BC Managed Care – PPO | Source: Ambulatory Visit | Attending: Surgery | Admitting: Surgery

## 2021-05-09 ENCOUNTER — Other Ambulatory Visit: Payer: Self-pay

## 2021-05-09 ENCOUNTER — Ambulatory Visit
Admission: RE | Admit: 2021-05-09 | Discharge: 2021-05-09 | Disposition: A | Discharge status: Home / Self Care | Payer: BC Managed Care – PPO | Source: Ambulatory Visit | Attending: Surgery | Admitting: Surgery

## 2021-05-09 DIAGNOSIS — D0511 Intraductal carcinoma in situ of right breast: Secondary | ICD-10-CM

## 2021-05-09 DIAGNOSIS — G894 Chronic pain syndrome: Secondary | ICD-10-CM | POA: Diagnosis not present

## 2021-05-09 DIAGNOSIS — Z01812 Encounter for preprocedural laboratory examination: Secondary | ICD-10-CM | POA: Insufficient documentation

## 2021-05-09 DIAGNOSIS — N6489 Other specified disorders of breast: Secondary | ICD-10-CM | POA: Diagnosis not present

## 2021-05-09 DIAGNOSIS — Z803 Family history of malignant neoplasm of breast: Secondary | ICD-10-CM | POA: Diagnosis not present

## 2021-05-09 DIAGNOSIS — Z170 Estrogen receptor positive status [ER+]: Secondary | ICD-10-CM | POA: Diagnosis not present

## 2021-05-09 MED ORDER — CHLORHEXIDINE GLUCONATE CLOTH 2 % EX PADS: 6.0000 | MEDICATED_PAD | Freq: Once | CUTANEOUS | Status: DC

## 2021-05-09 NOTE — Progress Notes (Signed)
EKG completed 05/09/21 reviewed by Dr. Luane School. Will proceed with surgery at [facility].

## 2021-05-09 NOTE — Progress Notes (Signed)

## 2021-05-10 ENCOUNTER — Ambulatory Visit (HOSPITAL_BASED_OUTPATIENT_CLINIC_OR_DEPARTMENT_OTHER): Payer: BC Managed Care – PPO | Admitting: Anesthesiology

## 2021-05-10 ENCOUNTER — Other Ambulatory Visit: Payer: Self-pay

## 2021-05-10 ENCOUNTER — Encounter (HOSPITAL_BASED_OUTPATIENT_CLINIC_OR_DEPARTMENT_OTHER): Payer: Self-pay | Admitting: Surgery

## 2021-05-10 ENCOUNTER — Ambulatory Visit
Admission: RE | Admit: 2021-05-10 | Discharge: 2021-05-10 | Disposition: A | Discharge status: Home / Self Care | Payer: BC Managed Care – PPO | Source: Ambulatory Visit | Attending: Surgery | Admitting: Surgery

## 2021-05-10 ENCOUNTER — Ambulatory Visit (HOSPITAL_BASED_OUTPATIENT_CLINIC_OR_DEPARTMENT_OTHER)
Admission: RE | Admit: 2021-05-10 | Discharge: 2021-05-10 | Disposition: A | Discharge status: Home / Self Care | Payer: BC Managed Care – PPO | Attending: Surgery | Admitting: Surgery

## 2021-05-10 ENCOUNTER — Encounter (HOSPITAL_BASED_OUTPATIENT_CLINIC_OR_DEPARTMENT_OTHER)
Admission: RE | Disposition: A | Discharge status: Home / Self Care | Payer: Self-pay | Source: Home / Self Care | Attending: Surgery

## 2021-05-10 DIAGNOSIS — D0511 Intraductal carcinoma in situ of right breast: Secondary | ICD-10-CM | POA: Diagnosis not present

## 2021-05-10 DIAGNOSIS — Z803 Family history of malignant neoplasm of breast: Secondary | ICD-10-CM | POA: Insufficient documentation

## 2021-05-10 DIAGNOSIS — G894 Chronic pain syndrome: Secondary | ICD-10-CM | POA: Insufficient documentation

## 2021-05-10 DIAGNOSIS — Z170 Estrogen receptor positive status [ER+]: Secondary | ICD-10-CM | POA: Insufficient documentation

## 2021-05-10 DIAGNOSIS — N6489 Other specified disorders of breast: Secondary | ICD-10-CM | POA: Insufficient documentation

## 2021-05-10 HISTORY — PX: BREAST LUMPECTOMY WITH RADIOACTIVE SEED LOCALIZATION: SHX6424

## 2021-05-10 HISTORY — DX: Other chronic pain: G89.29

## 2021-05-10 LAB — POCT PREGNANCY, URINE: Preg Test, Ur: NEGATIVE

## 2021-05-10 SURGERY — BREAST LUMPECTOMY WITH RADIOACTIVE SEED LOCALIZATION
Anesthesia: General | Site: Breast | Laterality: Right

## 2021-05-10 MED ORDER — DEXAMETHASONE SODIUM PHOSPHATE 10 MG/ML IJ SOLN
INTRAMUSCULAR | Status: DC | PRN
  Administered 2021-05-10: 10 mg via INTRAVENOUS

## 2021-05-10 MED ORDER — SCOPOLAMINE 1 MG/3DAYS TD PT72
1.0000 | MEDICATED_PATCH | TRANSDERMAL | Status: DC
  Administered 2021-05-10: 1.5 mg via TRANSDERMAL

## 2021-05-10 MED ORDER — PROPOFOL 10 MG/ML IV BOLUS
INTRAVENOUS | Status: DC | PRN
  Administered 2021-05-10: 200 mg via INTRAVENOUS

## 2021-05-10 MED ORDER — ONDANSETRON HCL 4 MG/2ML IJ SOLN
INTRAMUSCULAR | Status: DC | PRN
  Administered 2021-05-10: 4 mg via INTRAVENOUS

## 2021-05-10 MED ORDER — LIDOCAINE HCL (CARDIAC) PF 100 MG/5ML IV SOSY
PREFILLED_SYRINGE | INTRAVENOUS | Status: DC | PRN
  Administered 2021-05-10: 100 mg via INTRAVENOUS

## 2021-05-10 MED ORDER — CEFAZOLIN SODIUM-DEXTROSE 2-4 GM/100ML-% IV SOLN
INTRAVENOUS | Status: AC
  Filled 2021-05-10: qty 100

## 2021-05-10 MED ORDER — LACTATED RINGERS IV SOLN: INTRAVENOUS | Status: DC

## 2021-05-10 MED ORDER — OXYCODONE HCL 5 MG PO TABS: 5.0000 mg | ORAL_TABLET | Freq: Once | ORAL | Status: DC | PRN

## 2021-05-10 MED ORDER — BUPIVACAINE-EPINEPHRINE (PF) 0.25% -1:200000 IJ SOLN
INTRAMUSCULAR | Status: DC | PRN
  Administered 2021-05-10: 17 mL

## 2021-05-10 MED ORDER — ACETAMINOPHEN 500 MG PO TABS
ORAL_TABLET | ORAL | Status: AC
  Filled 2021-05-10: qty 2

## 2021-05-10 MED ORDER — ACETAMINOPHEN 500 MG PO TABS
1000.0000 mg | ORAL_TABLET | Freq: Once | ORAL | Status: AC
  Administered 2021-05-10: 1000 mg via ORAL

## 2021-05-10 MED ORDER — EPHEDRINE SULFATE 50 MG/ML IJ SOLN
INTRAMUSCULAR | Status: DC | PRN
  Administered 2021-05-10 (×2): 10 mg via INTRAVENOUS

## 2021-05-10 MED ORDER — DROPERIDOL 2.5 MG/ML IJ SOLN: 0.6250 mg | Freq: Once | INTRAMUSCULAR | Status: DC | PRN

## 2021-05-10 MED ORDER — SCOPOLAMINE 1 MG/3DAYS TD PT72
MEDICATED_PATCH | TRANSDERMAL | Status: AC
  Filled 2021-05-10: qty 1

## 2021-05-10 MED ORDER — FENTANYL CITRATE (PF) 100 MCG/2ML IJ SOLN
25.0000 ug | INTRAMUSCULAR | Status: DC | PRN
  Administered 2021-05-10 (×2): 50 ug via INTRAVENOUS

## 2021-05-10 MED ORDER — SODIUM CHLORIDE 0.9 % IV SOLN
INTRAVENOUS | Status: AC
  Filled 2021-05-10: qty 10

## 2021-05-10 MED ORDER — IBUPROFEN 800 MG PO TABS: 800.0000 mg | ORAL_TABLET | Freq: Three times a day (TID) | ORAL | 0 refills | Status: AC | PRN

## 2021-05-10 MED ORDER — PHENYLEPHRINE 40 MCG/ML (10ML) SYRINGE FOR IV PUSH (FOR BLOOD PRESSURE SUPPORT)
PREFILLED_SYRINGE | INTRAVENOUS | Status: AC
  Filled 2021-05-10: qty 60

## 2021-05-10 MED ORDER — FENTANYL CITRATE (PF) 100 MCG/2ML IJ SOLN
INTRAMUSCULAR | Status: AC
  Filled 2021-05-10: qty 2

## 2021-05-10 MED ORDER — FENTANYL CITRATE (PF) 100 MCG/2ML IJ SOLN
INTRAMUSCULAR | Status: DC | PRN
  Administered 2021-05-10: 100 ug via INTRAVENOUS

## 2021-05-10 MED ORDER — EPHEDRINE 5 MG/ML INJ
INTRAVENOUS | Status: AC
  Filled 2021-05-10: qty 30

## 2021-05-10 MED ORDER — PHENYLEPHRINE HCL (PRESSORS) 10 MG/ML IV SOLN
INTRAVENOUS | Status: DC | PRN
  Administered 2021-05-10 (×5): 120 ug via INTRAVENOUS

## 2021-05-10 MED ORDER — ONDANSETRON HCL 4 MG/2ML IJ SOLN
INTRAMUSCULAR | Status: AC
  Filled 2021-05-10: qty 2

## 2021-05-10 MED ORDER — LACTATED RINGERS IV SOLN: INTRAVENOUS | Status: DC | PRN

## 2021-05-10 MED ORDER — CLINDAMYCIN PHOSPHATE 900 MG/50ML IV SOLN
900.0000 mg | INTRAVENOUS | Status: AC
  Administered 2021-05-10: 900 mg via INTRAVENOUS

## 2021-05-10 MED ORDER — VANCOMYCIN HCL 500 MG IV SOLR
INTRAVENOUS | Status: DC | PRN
  Administered 2021-05-10: 500 mg via TOPICAL

## 2021-05-10 MED ORDER — MIDAZOLAM HCL 2 MG/2ML IJ SOLN
INTRAMUSCULAR | Status: DC | PRN
  Administered 2021-05-10: 2 mg via INTRAVENOUS

## 2021-05-10 MED ORDER — 0.9 % SODIUM CHLORIDE (POUR BTL) OPTIME
TOPICAL | Status: DC | PRN
  Administered 2021-05-10: 200 mL

## 2021-05-10 MED ORDER — CLINDAMYCIN PHOSPHATE 900 MG/50ML IV SOLN
INTRAVENOUS | Status: AC
  Filled 2021-05-10: qty 50

## 2021-05-10 MED ORDER — MIDAZOLAM HCL 2 MG/2ML IJ SOLN
INTRAMUSCULAR | Status: AC
  Filled 2021-05-10: qty 2

## 2021-05-10 MED ORDER — PROMETHAZINE HCL 25 MG/ML IJ SOLN: 6.2500 mg | INTRAMUSCULAR | Status: DC | PRN

## 2021-05-10 MED ORDER — OXYCODONE HCL 5 MG/5ML PO SOLN: 5.0000 mg | Freq: Once | ORAL | Status: DC | PRN

## 2021-05-10 SURGICAL SUPPLY — 45 items
ADH SKN CLS APL DERMABOND .7 (GAUZE/BANDAGES/DRESSINGS) ×1
APL PRP STRL LF DISP 70% ISPRP (MISCELLANEOUS) ×1
APPLIER CLIP 9.375 MED OPEN (MISCELLANEOUS) ×2
APR CLP MED 9.3 20 MLT OPN (MISCELLANEOUS) ×1
BINDER BREAST LRG (GAUZE/BANDAGES/DRESSINGS) IMPLANT
BINDER BREAST MEDIUM (GAUZE/BANDAGES/DRESSINGS) IMPLANT
BINDER BREAST XLRG (GAUZE/BANDAGES/DRESSINGS) IMPLANT
BINDER BREAST XXLRG (GAUZE/BANDAGES/DRESSINGS) ×2 IMPLANT
BLADE SURG 15 STRL LF DISP TIS (BLADE) ×1 IMPLANT
BLADE SURG 15 STRL SS (BLADE) ×2
CANISTER SUCT 1200ML W/VALVE (MISCELLANEOUS) ×2 IMPLANT
CHLORAPREP W/TINT 26 (MISCELLANEOUS) ×2 IMPLANT
CLIP APPLIE 9.375 MED OPEN (MISCELLANEOUS) ×1 IMPLANT
COVER BACK TABLE 60X90IN (DRAPES) ×2 IMPLANT
COVER MAYO STAND STRL (DRAPES) ×2 IMPLANT
COVER PROBE W GEL 5X96 (DRAPES) ×2 IMPLANT
DERMABOND ADVANCED (GAUZE/BANDAGES/DRESSINGS) ×1
DERMABOND ADVANCED .7 DNX12 (GAUZE/BANDAGES/DRESSINGS) ×1 IMPLANT
DRAPE LAPAROTOMY 100X72 PEDS (DRAPES) ×2 IMPLANT
DRAPE UTILITY XL STRL (DRAPES) ×2 IMPLANT
ELECT COATED BLADE 2.86 ST (ELECTRODE) ×2 IMPLANT
ELECT REM PT RETURN 9FT ADLT (ELECTROSURGICAL) ×2
ELECTRODE REM PT RTRN 9FT ADLT (ELECTROSURGICAL) ×1 IMPLANT
GLOVE SRG 8 PF TXTR STRL LF DI (GLOVE) ×2 IMPLANT
GLOVE SURG POLYISO LF SZ8 (GLOVE) ×4 IMPLANT
GLOVE SURG UNDER POLY LF SZ7 (GLOVE) ×2 IMPLANT
GLOVE SURG UNDER POLY LF SZ8 (GLOVE) ×4
GOWN STRL REUS W/ TWL LRG LVL3 (GOWN DISPOSABLE) ×1 IMPLANT
GOWN STRL REUS W/ TWL XL LVL3 (GOWN DISPOSABLE) ×2 IMPLANT
GOWN STRL REUS W/TWL LRG LVL3 (GOWN DISPOSABLE) ×2
GOWN STRL REUS W/TWL XL LVL3 (GOWN DISPOSABLE) ×4
KIT MARKER MARGIN INK (KITS) ×2 IMPLANT
NEEDLE HYPO 25X1 1.5 SAFETY (NEEDLE) ×2 IMPLANT
NS IRRIG 1000ML POUR BTL (IV SOLUTION) ×2 IMPLANT
PACK BASIN DAY SURGERY FS (CUSTOM PROCEDURE TRAY) ×2 IMPLANT
PENCIL SMOKE EVACUATOR (MISCELLANEOUS) ×2 IMPLANT
SLEEVE SCD COMPRESS KNEE MED (STOCKING) ×2 IMPLANT
SPONGE T-LAP 4X18 ~~LOC~~+RFID (SPONGE) ×2 IMPLANT
SUT MNCRL AB 4-0 PS2 18 (SUTURE) ×2 IMPLANT
SUT VICRYL 3-0 CR8 SH (SUTURE) ×2 IMPLANT
SYR CONTROL 10ML LL (SYRINGE) ×2 IMPLANT
TOWEL GREEN STERILE FF (TOWEL DISPOSABLE) ×2 IMPLANT
TRAY FAXITRON CT DISP (TRAY / TRAY PROCEDURE) ×2 IMPLANT
TUBE CONNECTING 20X1/4 (TUBING) ×2 IMPLANT
YANKAUER SUCT BULB TIP NO VENT (SUCTIONS) ×2 IMPLANT

## 2021-05-10 NOTE — Discharge Instructions (Addendum)
[place] Office Phone Number [phone]  BREAST BIOPSY/ PARTIAL MASTECTOMY: POST OP INSTRUCTIONS  Always review your discharge instruction sheet given to you by the facility where your surgery was performed.  IF YOU HAVE DISABILITY OR FAMILY LEAVE FORMS, YOU MUST BRING THEM TO THE OFFICE FOR PROCESSING.  DO NOT GIVE THEM TO YOUR DOCTOR.  A prescription for pain medication may be given to you upon discharge.  Take your pain medication as prescribed, if needed.  If narcotic pain medicine is not needed, then you may take acetaminophen (Tylenol) or ibuprofen (Advil) as needed. Take your usually prescribed medications unless otherwise directed If you need a refill on your pain medication, please contact your pharmacy.  They will contact our office to request authorization.  Prescriptions will not be filled after 5pm or on week-ends. You should eat very light the first 24 hours after surgery, such as soup, crackers, pudding, etc.  Resume your normal diet the day after surgery. Most patients will experience some swelling and bruising in the breast.  Ice packs and a good support bra will help.  Swelling and bruising can take several days to resolve.  It is common to experience some constipation if taking pain medication after surgery.  Increasing fluid intake and taking a stool softener will usually help or prevent this problem from occurring.  A mild laxative (Milk of Magnesia or Miralax) should be taken according to package directions if there are no bowel movements after 48 hours. Unless discharge instructions indicate otherwise, you may remove your bandages 24-48 hours after surgery, and you may shower at that time.  You may have steri-strips (small skin tapes) in place directly over the incision.  These strips should be left on the skin for 7-10 days.  If your surgeon used skin glue on the incision, you may shower in 24 hours.  The glue will flake off over the next 2-3 weeks.  Any  sutures or staples will be removed at the office during your follow-up visit. ACTIVITIES:  You may resume regular daily activities (gradually increasing) beginning the next day.  Wearing a good support bra or sports bra minimizes pain and swelling.  You may have sexual intercourse when it is comfortable. You may drive when you no longer are taking prescription pain medication, you can comfortably wear a seatbelt, and you can safely maneuver your car and apply brakes. RETURN TO WORK:  ______________________________________________________________________________________ Dennis Bast should see your doctor in the office for a follow-up appointment approximately two weeks after your surgery.  Your doctor's nurse will typically make your follow-up appointment when she calls you with your pathology report.  Expect your pathology report 2-3 business days after your surgery.  You may call to check if you do not hear from [place] after three days. OTHER INSTRUCTIONS: _______________________________________________________________________________________________ _____________________________________________________________________________________________________________________________________ _____________________________________________________________________________________________________________________________________ _____________________________________________________________________________________________________________________________________  WHEN TO CALL YOUR DOCTOR: Fever over 101.0 Nausea and/or vomiting. Extreme swelling or bruising. Continued bleeding from incision. Increased pain, redness, or drainage from the incision.  The clinic staff is available to answer your questions during regular business hours.  Please don't hesitate to call and ask to speak to one of the nurses for clinical concerns.  If you have a medical emergency, go to the nearest emergency room or call 911.  A surgeon from [facility] is always on call at the hospital.  For further questions, please visit [email or web address]    No tylenol until after 6pm today.   Post Anesthesia Home Care Instructions  Activity:  Get plenty of rest for the remainder of the day. A responsible individual must stay with you for 24 hours following the procedure.  For the next 24 hours, DO NOT: -Drive a car -Paediatric nurse -Drink alcoholic beverages -Take any medication unless instructed by your physician -Make any legal decisions or sign important papers.  Meals: Start with liquid foods such as gelatin or soup. Progress to regular foods as tolerated. Avoid greasy, spicy, heavy foods. If nausea and/or vomiting occur, drink only clear liquids until the nausea and/or vomiting subsides. Call your physician if vomiting continues.  Special Instructions/Symptoms: Your throat may feel dry or sore from the anesthesia or the breathing tube placed in your throat during surgery. If this causes discomfort, gargle with warm salt water. The discomfort should disappear within 24 hours.  If you had a scopolamine patch placed behind your ear for the management of post- operative nausea and/or vomiting:  1. The medication in the patch is effective for 72 hours, after which it should be removed.  Wrap patch in a tissue and discard in the trash. Wash hands thoroughly with soap and water. 2. You may remove the patch earlier than 72 hours if you experience unpleasant side effects which may include dry mouth, dizziness or visual disturbances. 3. Avoid touching the patch. Wash your hands with soap and water after contact with the patch.

## 2021-05-10 NOTE — Anesthesia Preprocedure Evaluation (Addendum)
Anesthesia Evaluation   Patient awake    Reviewed: Allergy & Precautions, NPO status , Patient's Chart, lab work & pertinent test results  Airway Mallampati: II  TM Distance: >3 FB Neck ROM: Full    Dental no notable dental hx.    Pulmonary asthma ,    Pulmonary exam normal breath sounds clear to auscultation       Cardiovascular hypertension, negative cardio ROS Normal cardiovascular exam Rhythm:Regular Rate:Normal     Neuro/Psych PSYCHIATRIC DISORDERS Anxiety Depression Bipolar Disorder  Neuromuscular disease (carpal tunnel)    GI/Hepatic Neg liver ROS, GERD  ,  Endo/Other  negative endocrine ROS  Renal/GU negative Renal ROS  negative genitourinary   Musculoskeletal  (+) Arthritis  (chronic pain), Osteoarthritis,  Fibromyalgia -  Abdominal   Peds negative pediatric ROS (+)  Hematology negative hematology ROS (+)   Anesthesia Other Findings Cervical cancer Breast cancer  Reproductive/Obstetrics negative OB ROS                            Anesthesia Physical Anesthesia Plan  ASA: 3  Anesthesia Plan: General   Post-op Pain Management:    Induction: Intravenous  PONV Risk Score and Plan: 3 and Scopolamine patch - Pre-op, Treatment may vary due to age or medical condition, Midazolam, Dexamethasone and Ondansetron  Airway Management Planned: LMA  Additional Equipment: None  Intra-op Plan:   Post-operative Plan: Extubation in OR  Informed Consent: I have reviewed the patients History and Physical, chart, labs and discussed the procedure including the risks, benefits and alternatives for the proposed anesthesia with the patient or authorized representative who has indicated his/her understanding and acceptance.     Dental advisory given  Plan Discussed with: CRNA, Anesthesiologist and Surgeon  Anesthesia Plan Comments:         Anesthesia Quick Evaluation

## 2021-05-10 NOTE — Interval H&P Note (Signed)
History and Physical Interval Note:  05/10/2021 12:24 PM  Kathy Hickman  has presented today for surgery, with the diagnosis of RIGHT BREAST DCIS.  The various methods of treatment have been discussed with the patient and family. After consideration of risks, benefits and other options for treatment, the patient has consented to  Procedure(s): RIGHT BREAST LUMPECTOMY WITH RADIOACTIVE SEED LOCALIZATION (Right) as a surgical intervention.  The patient's history has been reviewed, patient examined, no change in status, stable for surgery.  I have reviewed the patient's chart and labs.  Questions were answered to the patient's satisfaction.     [place]

## 2021-05-10 NOTE — Transfer of Care (Signed)
Immediate Anesthesia Transfer of Care Note  Patient: Kathy Hickman  Procedure(s) Performed: RIGHT BREAST LUMPECTOMY WITH RADIOACTIVE SEED LOCALIZATION (Right: Breast)  Patient Location: PACU  Anesthesia Type:General  Level of Consciousness: awake, alert  and oriented  Airway & Oxygen Therapy: Patient Spontanous Breathing and Patient connected to face mask oxygen  Post-op Assessment: Report given to RN and Post -op Vital signs reviewed and stable  Post vital signs: Reviewed and stable  Last Vitals:  Vitals Value Taken Time  BP    Temp    Pulse 78 05/10/21 1348  Resp    SpO2 100 % 05/10/21 1348  Vitals shown include unvalidated device data.  Last Pain:  Vitals:   05/10/21 1144  TempSrc: Oral  PainSc: 6       Patients Stated Pain Goal: 5 (59/27/63 9432)  Complications: No notable events documented.

## 2021-05-10 NOTE — Op Note (Addendum)
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   Anesthesia: LMA with 0.25% Marcaine plain  EBL: Minimal  Specimen: Right breast tissue with seed and clip verified by Faxitron plus additional cavity margins all oriented with ink  Drains: None  IV fluids: Per anesthesia record  Indications for procedure: The patient is a 41 year old female with right breast DCIS diagnosed on a core biopsy.  Initial biopsy showed a radial scar with a small focus of DCIS.  We discussed options of treatment to include lumpectomy, mastectomy, medical management.  She opted for right breast seed localized lumpectomy after reviewing her options of meeting with medical radiation oncology.The procedure has been discussed with the patient. Alternatives to surgery have been discussed with the patient.  Risks of surgery include bleeding,  Infection,  Seroma formation, death,  and the need for further surgery.   The patient understands and wishes to proceed.       Description of procedure: The patient was met in the holding area and questions were answered.  Right side was marked as correct site.  Films were available for review.  She was then taken back to the operating room.  She is placed supine upon the OR table.  After induction of general anesthesia, right breast was prepped draped in sterile fashion timeout performed.  She received appropriate preoperative antibiotics.  Neoprobe used to find the seed right breast upper outer quadrant.  Curvilinear incision was made over the area.  Dissection was carried down and all tissue and the seed and clip were excised with a  grossly negative margin.  We did do shave margins of all the margins.  All tissue was oriented with ink.  The Faxitron showed the seed and clip to be in the specimen.  Irrigation was used.  Vancomycin powder placed.  Wound closed with 3-0 Vicryl and 4 Monocryl.  Dermabond applied.  All counts found to be correct.  Breast binder placed.  Patient was awoke extubated taken recovery in satisfactory condition.

## 2021-05-10 NOTE — H&P (Signed)
History of Present Illness: Kathy Hickman is a 40 y.o. female who is seen today as an office consultation at the request of Dr. Jana Hakim for evaluation of Breast Cancer .   Patient seen today in the [facility] for mammographic abnormality noted on screening mammogram of her right breast upper outer quadrant. Core biopsy showed DCIS arising within a radial scar. This was ER positive PR positive. And this was felt to be low to intermediate grade. She has a strong family history of breast cancer with an aunt. No other history of breast pain, nipple discharge or mass.  Review of Systems: A complete review of systems was obtained from the patient. I have reviewed this information and discussed as appropriate with the patient. See HPI as well for other ROS.  Review of Systems  Constitutional: Negative.  HENT: Negative.  Eyes: Negative.  Respiratory: Negative.  Gastrointestinal: Negative.  Genitourinary: Negative.  Musculoskeletal: Positive for back pain, joint pain and myalgias.  Skin: Negative.  Neurological: Negative.  Endo/Heme/Allergies: Negative.  Psychiatric/Behavioral: The patient is nervous/anxious.    Medical History: Past Medical History:  Diagnosis Date   Anemia   Anxiety   Arthritis   Asthma, unspecified asthma severity, unspecified whether complicated, unspecified whether persistent   Hypertension   Patient Active Problem List  Diagnosis   Ductal carcinoma in situ (DCIS) of right breast   Chronic pain syndrome   Past Surgical History:  Procedure Laterality Date   CESAREAN SECTION N/A  2002   ENDOSCOPIC CARPAL TUNNEL RELEASE Bilateral  2018    Allergies  Allergen Reactions   Amoxicillin Hives, Shortness Of Breath and Swelling   Sulfa (Sulfonamide Antibiotics) Shortness Of Breath, Swelling and Hives   Latex Rash   Current Outpatient Medications on File Prior to Visit  Medication Sig Dispense Refill   ALPRAZolam (XANAX) 1 MG tablet Take by mouth   FLUoxetine  (PROZAC) 10 MG capsule Take by mouth   fluticasone propionate (FLONASE) 50 mcg/actuation nasal spray Place into one nostril   SUMAtriptan (IMITREX) 100 MG tablet Take by mouth   tiZANidine (ZANAFLEX) 4 MG tablet Take by mouth   No current facility-administered medications on file prior to visit.   Family History  Problem Relation Age of Onset   Colon cancer Paternal Aunt   Bipolar disorder Paternal Aunt   Stroke Maternal Grandmother   High blood pressure (Hypertension) Maternal Grandmother   Diabetes Maternal Grandfather   Diabetes Paternal Grandfather    Social History   Tobacco Use  Smoking Status Never Smoker  Smokeless Tobacco Never Used    Social History   Socioeconomic History   Marital status: Unknown  Tobacco Use   Smoking status: Never Smoker   Smokeless tobacco: Never Used  Scientific laboratory technician Use: Never used  Substance and Sexual Activity   Alcohol use: Never   Drug use: Never   Objective:  There were no vitals filed for this visit.  There is no height or weight on file to calculate BMI.  Physical Exam Constitutional:  Appearance: Normal appearance.  HENT:  Head: Normocephalic.  Nose: Nose normal.  Eyes:  Extraocular Movements: Extraocular movements intact.  Pupils: Pupils are equal, round, and reactive to light.  Cardiovascular:  Rate and Rhythm: Normal rate and regular rhythm.  Pulmonary:  Effort: Pulmonary effort is normal.  Breath sounds: No stridor.  Chest:  Breasts:  Right: Normal. No swelling, inverted nipple or mass.  Left: Normal. No swelling, inverted nipple or mass.   Musculoskeletal:  General: Normal range of motion.  Cervical back: Normal range of motion and neck supple.  Lymphadenopathy:  Cervical: No cervical adenopathy.  Upper Body:  Right upper body: No supraclavicular or axillary adenopathy.  Left upper body: No supraclavicular or axillary adenopathy.  Skin: General: Skin is warm.  Neurological:  Mental Status: She is  alert.     Labs, Imaging and Diagnostic Testing: ADDITIONAL INFORMATION: PROGNOSTIC INDICATORS Results: IMMUNOHISTOCHEMICAL AND MORPHOMETRIC ANALYSIS PERFORMED MANUALLY Estrogen Receptor: 85%, POSITIVE, STRONG STAINING INTENSITY Progesterone Receptor: 10%, POSITIVE, STRONG STAINING INTENSITY REFERENCE RANGE ESTROGEN RECEPTOR NEGATIVE 0% POSITIVE =>1% REFERENCE RANGE PROGESTERONE RECEPTOR NEGATIVE 0% POSITIVE =>1% All controls stained appropriately Vicente Males MD Pathologist, Electronic Signature ( Signed 04/23/2021) FINAL DIAGNOSIS Diagnosis Breast, right, needle core biopsy, 10 o'clock 6 cmfn - DUCTAL CARCINOMA IN SITU ARISING IN A COMPLEX SCLEROSING LESION. 1 of 2 FINAL for NAHLA, LUKIN ([id number]) Microscopic Comment Immunohistochemistry reveals predominately preserved basal markers (p63, calponin, SMM). There is a single gland with attenuated markers and invasion is not entirely excluded. Prognostic markers will be ordered. Dr. Melina Copa has reviewed the case. The case was called to Dr. Luan Pulling on 04/19/2021. Vicente Males MD Pathologist, Electronic Signature (Case signed 04/19/2021)  Mammogram show a 2 cm distortion with small masslike lesion right breast upper outer quadrant. Assessment and Plan:  Diagnoses and all orders for this visit:  Ductal carcinoma in situ (DCIS) of right breast    Discussed breast conserving surgery versus mastectomy with possible reconstruction. She will get genetic testing given her family history. Right now, she is opted for right breast seed localized lumpectomy. We discussed the lymph nodes as well and this may need to be reassessed at a later time depending on final pathology. She will see medical and radiation oncology. Risk lumpectomy include bleeding, infection, cosmetic deformity, pain, need further treatments and/or procedures, blood port, stroke, death, and the need for the medical management. She agrees to proceed.  No  follow-ups on file.  Kennieth Francois, MD   I spent a total of 45 minutes in both face-to-face and non-face-to-face activities for this visit on the date of this encounter.

## 2021-05-11 ENCOUNTER — Encounter (HOSPITAL_BASED_OUTPATIENT_CLINIC_OR_DEPARTMENT_OTHER): Payer: Self-pay | Admitting: Surgery

## 2021-05-11 NOTE — Anesthesia Postprocedure Evaluation (Signed)
Anesthesia Post Note  Patient: SHADEE RATHOD  Procedure(s) Performed: RIGHT BREAST LUMPECTOMY WITH RADIOACTIVE SEED LOCALIZATION (Right: Breast)     Patient location during evaluation: PACU Anesthesia Type: General Level of consciousness: awake and alert Pain management: pain level controlled Vital Signs Assessment: post-procedure vital signs reviewed and stable Respiratory status: spontaneous breathing, nonlabored ventilation and respiratory function stable Cardiovascular status: blood pressure returned to baseline and stable Postop Assessment: no apparent nausea or vomiting Anesthetic complications: no   No notable events documented.  Last Vitals:  Vitals:   05/10/21 1415 05/10/21 1445  BP: 100/80 125/67  Pulse: 70 68  Resp: 12 14  Temp:  (!) 36.2 C  SpO2: 98% 96%    Last Pain:  Vitals:   05/10/21 1445  TempSrc:   PainSc: 4                  Candra R Lovena Kluck

## 2021-05-16 LAB — SURGICAL PATHOLOGY

## 2021-05-17 ENCOUNTER — Encounter: Payer: Self-pay | Admitting: *Deleted

## 2021-05-17 ENCOUNTER — Encounter: Payer: Self-pay | Admitting: Surgery

## 2021-06-07 ENCOUNTER — Encounter: Payer: Self-pay | Admitting: Radiation Oncology

## 2021-06-07 ENCOUNTER — Ambulatory Visit: Payer: BC Managed Care – PPO

## 2021-06-07 ENCOUNTER — Encounter: Payer: Self-pay | Admitting: General Practice

## 2021-06-07 ENCOUNTER — Ambulatory Visit
Admission: RE | Admit: 2021-06-07 | Discharge: 2021-06-07 | Disposition: A | Discharge status: Home / Self Care | Payer: BC Managed Care – PPO | Source: Ambulatory Visit | Attending: Radiation Oncology | Admitting: Radiation Oncology

## 2021-06-07 ENCOUNTER — Ambulatory Visit: Payer: BC Managed Care – PPO | Admitting: Radiation Oncology

## 2021-06-07 VITALS — Resp 19 | Ht 64.0 in | Wt 226.0 lb

## 2021-06-07 DIAGNOSIS — D0511 Intraductal carcinoma in situ of right breast: Secondary | ICD-10-CM | POA: Insufficient documentation

## 2021-06-07 DIAGNOSIS — Z79899 Other long term (current) drug therapy: Secondary | ICD-10-CM | POA: Diagnosis not present

## 2021-06-07 DIAGNOSIS — M797 Fibromyalgia: Secondary | ICD-10-CM | POA: Insufficient documentation

## 2021-06-07 DIAGNOSIS — Z800 Family history of malignant neoplasm of digestive organs: Secondary | ICD-10-CM | POA: Insufficient documentation

## 2021-06-07 DIAGNOSIS — I10 Essential (primary) hypertension: Secondary | ICD-10-CM | POA: Diagnosis not present

## 2021-06-07 DIAGNOSIS — Z801 Family history of malignant neoplasm of trachea, bronchus and lung: Secondary | ICD-10-CM | POA: Insufficient documentation

## 2021-06-07 DIAGNOSIS — M1990 Unspecified osteoarthritis, unspecified site: Secondary | ICD-10-CM | POA: Diagnosis not present

## 2021-06-07 DIAGNOSIS — Z803 Family history of malignant neoplasm of breast: Secondary | ICD-10-CM | POA: Diagnosis not present

## 2021-06-07 DIAGNOSIS — Z170 Estrogen receptor positive status [ER+]: Secondary | ICD-10-CM | POA: Insufficient documentation

## 2021-06-07 DIAGNOSIS — F329 Major depressive disorder, single episode, unspecified: Secondary | ICD-10-CM | POA: Diagnosis not present

## 2021-06-07 DIAGNOSIS — L089 Local infection of the skin and subcutaneous tissue, unspecified: Secondary | ICD-10-CM | POA: Diagnosis not present

## 2021-06-07 MED ORDER — DOXYCYCLINE HYCLATE 100 MG PO TABS: 100.0000 mg | ORAL_TABLET | Freq: Two times a day (BID) | ORAL | 0 refills | Status: DC

## 2021-06-07 MED ORDER — CIPROFLOXACIN HCL 500 MG PO TABS: 500.0000 mg | ORAL_TABLET | Freq: Two times a day (BID) | ORAL | 0 refills | Status: DC

## 2021-06-07 NOTE — Progress Notes (Addendum)
Patient reports RT breast tenderness 1/10 and slow healing of incision line on RT breast. No other symptoms reported at this time.  Meaningful use complete.  Resp 19   Ht 5\' 4"  (1.626 m)   Wt 226 lb (102.5 kg)   BMI 38.79 kg/m    Patient states "NO chances of pregnancy."

## 2021-06-07 NOTE — Progress Notes (Signed)
Radiation Oncology         [phone] ________________________________  Name: Kathy Hickman        MRN: [id number]  Date of Service: 06/07/2021 DOB: 1981-07-15  NF:[id number], Kathy Quince, MD  Magrinat, Kathy Dad, MD     REFERRING PHYSICIAN: Magrinat, Kathy Dad, MD   DIAGNOSIS: The encounter diagnosis was Ductal carcinoma in situ (DCIS) of right breast.   HISTORY OF PRESENT ILLNESS: Kathy Hickman is a 40 y.o. female with a diagnosis of right breast Hickman. The patient was noted to have a screening detected mass and distortion. Diagnostic imaging showed a 2 cm area of distortion and a 7 mm mass, and by ultrasound the mass was noted at 10:00 measuring up to 8 mm, and her axilla was negative for adenopathy. A biopsy on 04/17/21 showed an intermediate grade DCIS with necrosis in a CSL, microinvasion was not excluded. Her tumor was ER/PR positive.   Since her last visit, she has undergone right lumpectomy on 05/10/2021.  Final pathology revealed residual complex sclerosing lesion with florid usual ductal hyperplasia and no residual DCIS.  Additional right medial posterior and anterior superior margins showed columnar cell and fibrocystic change without residual carcinoma, a right additional lateral inferior margin excision was negative for disease as well.  Of note she was treated by her PCP for a skin infection at her incision site and was treated with a week of Doxycycline. She is seen today to discuss adjuvant radiotherapy via MyChart.    PREVIOUS RADIATION THERAPY: No   PAST MEDICAL HISTORY:  Past Medical History:  Diagnosis Date   Allergy    Anxiety    Arthritis    osteoarthritis   Asthma    Carpal tunnel syndrome    Depression    Fibromyalgia    Hypertension    Pain, chronic    Pneumonia        PAST SURGICAL HISTORY: Past Surgical History:  Procedure Laterality Date   BREAST LUMPECTOMY WITH RADIOACTIVE SEED LOCALIZATION Right 05/10/2021   Procedure: RIGHT BREAST LUMPECTOMY  WITH RADIOACTIVE SEED LOCALIZATION;  Surgeon: Kathy Luna, MD;  Location: [place];  Service: General;  Laterality: Right;   CARPAL TUNNEL RELEASE     CESAREAN SECTION  08/05/2000   Dr. Ruthann Hickman     FAMILY HISTORY:  Family History  Problem Relation Age of Onset   Anxiety disorder Mother    Breast Hickman Paternal Aunt 1   Colon Hickman Paternal Aunt 35   Pancreatic Hickman Maternal Grandmother 91   Lung Hickman Paternal Grandfather        dx. 57s, smoked   Asthma Kathy [facility]ickman    Allergies Kathy Hickman      SOCIAL HISTORY:  reports that she has never smoked. She has never used smokeless tobacco. She reports that she does not drink alcohol and does not use drugs. The patient is divorced and lives in [place]. She works for [employer] [place] in [place] and works from 2 am- 9 am M-Sat.     ALLERGIES: Amoxicillin, Sulfonamide derivatives, and Latex   MEDICATIONS:  Current Outpatient Medications  Medication Sig Dispense Refill   ALPRAZolam (XANAX) 0.5 MG tablet Take 0.5 mg by mouth daily.     Ascorbic Acid (VITAMIN C) 1000 MG tablet Take 1,000 mg by mouth daily.     atenolol (TENORMIN) 50 MG tablet Take 50 mg by mouth daily.     cetirizine-pseudoephedrine (ZYRTEC-D) 5-120 MG per tablet Take 1 tablet by mouth daily. allergies  Cholecalciferol (D3-1000 PO) Take by mouth.     clomiPRAMINE (ANAFRANIL) 25 MG capsule 25 mg.     Cod Liver Oil CAPS Take by mouth.     docusate sodium (COLACE) 100 MG capsule Take 1 capsule by mouth every other day.     ferrous gluconate (FERGON) 240 (27 FE) MG tablet 1 tablet daily.     fluticasone (FLONASE) 50 MCG/ACT nasal spray Place 1 spray into both nostrils daily.     gabapentin (NEURONTIN) 300 MG capsule Take 2 capsules by mouth in the morning and at bedtime.     HYDROmorphone HCl ER 16 MG TB24 Take 1 tablet by mouth daily at 4 PM.     ibuprofen (ADVIL) 200 MG tablet Take 200 mg by mouth every 6 (six) hours as needed.     ibuprofen (ADVIL) 800 MG tablet  Take 1 tablet (800 mg total) by mouth every 8 (eight) hours as needed. 30 tablet 0   LYSINE PO Take by mouth.     montelukast (SINGULAIR) 10 MG tablet Take 10 mg by mouth daily.     Multiple Vitamins-Minerals (MULTIVITAMINS THER. W/MINERALS) TABS Take 1 tablet by mouth daily.       oxyCODONE-acetaminophen (PERCOCET) 10-325 MG tablet Take 1 tablet by mouth every 4 (four) hours as needed for pain. Takes 5 x/day     SUMAtriptan (IMITREX) 100 MG tablet Take 100 mg by mouth every 2 (two) hours as needed.       tiZANidine (ZANAFLEX) 4 MG tablet Take 4 mg by mouth in the morning and at bedtime.     topiramate (TOPAMAX) 25 MG tablet Take 2 tablets by mouth daily.     No current facility-administered medications for this encounter.     REVIEW OF SYSTEMS: On review of systems, the patient reports that she is doing okay. She feels like she's still pretty tired, and reports she has had yellow drainage and redness of her lumpectomy incision site. She completed doxycycline and denies any fevers or chills. She continues with chronic pain from her fibromyalgia.  She reports that she is doing pretty well but still a little bit sore since her surgery.  No other complaints are verbalized.     PHYSICAL EXAM:   Vitals unable to assess due to encounter type.  In general this is a well appearing caucasian female in no acute distress. She's alert and oriented x4 and appropriate throughout the examination. Cardiopulmonary assessment is negative for acute distress and she exhibits normal effort. Her right breast incision site is erythematous with a small area of separation along the lateral aspect of the incision site.     ECOG = 1  0 - Asymptomatic (Fully active, able to carry on all predisease activities without restriction)  1 - Symptomatic but completely ambulatory (Restricted in physically strenuous activity but ambulatory and able to carry out work of a light or sedentary nature. For example, light  housework, office work)  2 - Symptomatic, <50% in bed during the day (Ambulatory and capable of all self care but unable to carry out any work activities. Up and about more than 50% of waking hours)  3 - Symptomatic, >50% in bed, but not bedbound (Capable of only limited self-care, confined to bed or chair 50% or more of waking hours)  4 - Bedbound (Completely disabled. Cannot carry on any self-care. Totally confined to bed or chair)  5 - Death   Eustace Pen MM, Creech RH, Tormey DC, et al. [phone]). "Toxicity and  response criteria of the [facility]". [place] Oncol. 5 (6): 649-55    LABORATORY DATA:  Lab Results  Component Value Date   WBC 7.4 04/25/2021   HGB 12.9 04/25/2021   HCT 38.4 04/25/2021   MCV 87.1 04/25/2021   PLT 279 04/25/2021   Lab Results  Component Value Date   NA 140 04/25/2021   K 4.6 04/25/2021   CL 110 04/25/2021   CO2 21 (L) 04/25/2021   Lab Results  Component Value Date   ALT 19 04/25/2021   AST 17 04/25/2021   ALKPHOS 70 04/25/2021   BILITOT 0.3 04/25/2021      RADIOGRAPHY: MM Breast Surgical Specimen  Result Date: 05/10/2021 CLINICAL DATA:  Evaluate surgical specimen following RIGHT lumpectomy for breast Hickman. EXAM: SPECIMEN RADIOGRAPH OF THE RIGHT BREAST COMPARISON:  Previous exam(s). FINDINGS: Status post excision of the RIGHT breast. The radioactive seed and heart biopsy marker clip are present and completely intact. IMPRESSION: Specimen radiograph of the RIGHT breast. Electronically Signed   By: Margarette Canada M.D.   On: 05/10/2021 13:31  MM RT RADIOACTIVE SEED LOC MAMMO GUIDE  Result Date: 05/09/2021 CLINICAL DATA:  40 year old female for radioactive seed localization of RIGHT breast Hickman prior to lumpectomy. EXAM: MAMMOGRAPHIC GUIDED RADIOACTIVE SEED LOCALIZATION OF THE RIGHT BREAST COMPARISON:  Previous exam(s). FINDINGS: Patient presents for radioactive seed localization prior to RIGHT lumpectomy. I met with the patient  and we discussed the procedure of seed localization including benefits and alternatives. We discussed the high likelihood of a successful procedure. We discussed the risks of the procedure including infection, bleeding, tissue injury and further surgery. We discussed the low dose of radioactivity involved in the procedure. Informed, written consent was given. The usual time-out protocol was performed immediately prior to the procedure. Using mammographic guidance, sterile technique, 1% lidocaine and an I-125 radioactive seed, the heart shaped biopsy clip was localized using a LATERAL approach. The follow-up mammogram images confirm the seed in the expected location and were marked for Dr. Brantley Stage. Follow-up survey of the patient confirms presence of the radioactive seed. Order number of I-125 seed:  [id number]. Total activity:  8.242 millicuries.  Reference Date: 04/11/2021. The patient tolerated the procedure well and was [facility]. She was given instructions regarding seed removal. IMPRESSION: Radioactive seed localization RIGHT breast. No apparent complications. Electronically Signed   By: Margarette Canada M.D.   On: 05/09/2021 14:57      IMPRESSION/PLAN: 1. 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 Dr. Lisbeth Renshaw discusses the final pathology findings and reviews the nature of early stage breast disease.  She has done well since surgery.  Despite that no residual disease was seen at the time of her surgery, Dr. Lisbeth Renshaw reviews the rationale for external radiotherapy to the breast  to reduce risks of local recurrence followed by antiestrogen therapy. We discussed the risks, benefits, short, and long term effects of radiotherapy, as well as the curative intent, and the patient is interested in proceeding. Dr. Lisbeth Renshaw discusses the delivery and logistics of radiotherapy and anticipates a course of 6 1/2 weeks of radiotherapy.  She will come for simulation and our staff will call her to  coordinate the week of 06/18/21 at which time she will signed written consent to proceed. 2. Skin infection. She is allergic to sulfa and to pcn products. We discussed starting cipro. I will also copy Dr. Brantley Stage and I encouraged her to call his office if her  symptoms do not improve. I also asked her to use a pen or marker to draw a border of the erythema so we would know how the antibiotics are working. 3. Contraceptive Counseling. The patient is not sexually active and had a negative pregnancy test the day of her surgery. If she becomes active knows to use condoms for contraception.   This encounter was provided by telemedicine platform MyChart.  The patient has provided two factor identification and has given verbal consent for this type of encounter and has been advised to only accept a meeting of this type in a secure network environment. The time spent during this encounter was 45 minutes including preparation, discussion, and coordination of the patient's care. The attendants for this meeting include Dr. Lisbeth Renshaw, Hayden Pedro  and Caren Griffins and her mother Rolm Baptise. During the encounter,  Dr. Lisbeth Renshaw, and Hayden Pedro were located at [facility].  Caren Griffins was located at home with her mother Rolm Baptise.    The above documentation reflects my direct findings during this shared patient visit. Please see the separate note by Dr. Lisbeth Renshaw on this date for the remainder of the patient's plan of care.    Carola Rhine, [facility]    **Disclaimer: This note was dictated with voice recognition software. Similar sounding words can inadvertently be transcribed and this note may contain transcription errors which may not have been corrected upon publication of note.**

## 2021-06-07 NOTE — Progress Notes (Signed)
[place] Psychosocial Distress Screening Clinical Social Work  Clinical Social Work was referred by distress screening protocol.  The patient scored a 6 on the Psychosocial Distress Thermometer which indicates moderate distress. Clinical Social Worker contacted patient by phone to assess for distress and other psychosocial needs. No answer, left generic VM and reminded her about [place] and available resources.  Encouraged her to call back as needed for support/resources.    [facility] DISTRESS SCREENING 06/07/2021  Screening Type   Distress experienced in past week (1-10) 6  Practical problem type   Emotional problem type Nervousness/Anxiety;Adjusting to illness  Information Concerns Type   Physical Problem type     Clinical Social Worker follow up needed: No.  If yes, follow up plan:  Beverely Pace, [place], LCSW Clinical Social Worker Phone:  [phone]

## 2021-06-13 ENCOUNTER — Encounter: Payer: Self-pay | Admitting: *Deleted

## 2021-06-22 ENCOUNTER — Other Ambulatory Visit: Payer: Self-pay

## 2021-06-22 ENCOUNTER — Ambulatory Visit
Admission: RE | Admit: 2021-06-22 | Discharge: 2021-06-22 | Disposition: A | Discharge status: Home / Self Care | Payer: BC Managed Care – PPO | Source: Ambulatory Visit | Attending: Radiation Oncology | Admitting: Radiation Oncology

## 2021-06-22 DIAGNOSIS — Z510 Encounter for antineoplastic radiation therapy: Secondary | ICD-10-CM | POA: Diagnosis present

## 2021-06-22 DIAGNOSIS — D0511 Intraductal carcinoma in situ of right breast: Secondary | ICD-10-CM | POA: Insufficient documentation

## 2021-06-22 DIAGNOSIS — Z170 Estrogen receptor positive status [ER+]: Secondary | ICD-10-CM | POA: Diagnosis not present

## 2021-06-26 ENCOUNTER — Inpatient Hospital Stay: Payer: BC Managed Care – PPO | Attending: Oncology | Admitting: Oncology

## 2021-06-26 DIAGNOSIS — D0511 Intraductal carcinoma in situ of right breast: Secondary | ICD-10-CM | POA: Diagnosis not present

## 2021-07-02 ENCOUNTER — Other Ambulatory Visit: Payer: Self-pay

## 2021-07-02 ENCOUNTER — Ambulatory Visit
Admission: RE | Admit: 2021-07-02 | Discharge: 2021-07-02 | Disposition: A | Discharge status: Home / Self Care | Payer: BC Managed Care – PPO | Source: Ambulatory Visit | Attending: Radiation Oncology | Admitting: Radiation Oncology

## 2021-07-02 DIAGNOSIS — D0511 Intraductal carcinoma in situ of right breast: Secondary | ICD-10-CM | POA: Diagnosis not present

## 2021-07-03 ENCOUNTER — Ambulatory Visit
Admission: RE | Admit: 2021-07-03 | Discharge: 2021-07-03 | Disposition: A | Discharge status: Home / Self Care | Payer: BC Managed Care – PPO | Source: Ambulatory Visit | Attending: Radiation Oncology | Admitting: Radiation Oncology

## 2021-07-03 DIAGNOSIS — D0511 Intraductal carcinoma in situ of right breast: Secondary | ICD-10-CM | POA: Diagnosis not present

## 2021-07-04 ENCOUNTER — Ambulatory Visit
Admission: RE | Admit: 2021-07-04 | Discharge: 2021-07-04 | Disposition: A | Discharge status: Home / Self Care | Payer: BC Managed Care – PPO | Source: Ambulatory Visit | Attending: Radiation Oncology | Admitting: Radiation Oncology

## 2021-07-04 ENCOUNTER — Other Ambulatory Visit: Payer: Self-pay

## 2021-07-04 DIAGNOSIS — D0511 Intraductal carcinoma in situ of right breast: Secondary | ICD-10-CM | POA: Diagnosis not present

## 2021-07-05 ENCOUNTER — Ambulatory Visit
Admission: RE | Admit: 2021-07-05 | Discharge: 2021-07-05 | Disposition: A | Discharge status: Home / Self Care | Payer: BC Managed Care – PPO | Source: Ambulatory Visit | Attending: Radiation Oncology | Admitting: Radiation Oncology

## 2021-07-05 DIAGNOSIS — D0511 Intraductal carcinoma in situ of right breast: Secondary | ICD-10-CM | POA: Diagnosis not present

## 2021-07-06 ENCOUNTER — Other Ambulatory Visit: Payer: Self-pay

## 2021-07-06 ENCOUNTER — Ambulatory Visit
Admission: RE | Admit: 2021-07-06 | Discharge: 2021-07-06 | Disposition: A | Discharge status: Home / Self Care | Payer: BC Managed Care – PPO | Source: Ambulatory Visit | Attending: Radiation Oncology | Admitting: Radiation Oncology

## 2021-07-06 DIAGNOSIS — D0511 Intraductal carcinoma in situ of right breast: Secondary | ICD-10-CM

## 2021-07-06 MED ORDER — ALRA NON-METALLIC DEODORANT (RAD-ONC)
1.0000 | Freq: Once | TOPICAL | Status: AC
Start: 2021-07-06 — End: 2021-07-06
  Administered 2021-07-06: 1 via TOPICAL

## 2021-07-06 MED ORDER — RADIAPLEXRX EX GEL: Freq: Once | CUTANEOUS | Status: AC

## 2021-07-06 NOTE — Progress Notes (Signed)
Pt here for patient teaching.  Pt given Radiation and You booklet, skin care instructions, Alra deodorant, and Radiaplex gel.  Reviewed areas of pertinence such as fatigue, hair loss, skin changes, breast tenderness, and breast swelling . Pt able to give teach back of to pat skin and use unscented/gentle soap,apply Radiaplex bid, avoid applying anything to skin within 4 hours of treatment, avoid wearing an under wire bra, and to use an electric razor if they must shave. Pt verbalizes understanding of information given and will contact nursing with any questions or concerns.    Trinia Georgi M. Joanna Hall RN, BSN      

## 2021-07-09 ENCOUNTER — Other Ambulatory Visit: Payer: Self-pay

## 2021-07-09 ENCOUNTER — Ambulatory Visit
Admission: RE | Admit: 2021-07-09 | Discharge: 2021-07-09 | Disposition: A | Discharge status: Home / Self Care | Payer: BC Managed Care – PPO | Source: Ambulatory Visit | Attending: Radiation Oncology | Admitting: Radiation Oncology

## 2021-07-09 DIAGNOSIS — D0511 Intraductal carcinoma in situ of right breast: Secondary | ICD-10-CM | POA: Diagnosis not present

## 2021-07-10 ENCOUNTER — Ambulatory Visit
Admission: RE | Admit: 2021-07-10 | Discharge: 2021-07-10 | Disposition: A | Discharge status: Home / Self Care | Payer: BC Managed Care – PPO | Source: Ambulatory Visit | Attending: Radiation Oncology | Admitting: Radiation Oncology

## 2021-07-10 DIAGNOSIS — D0511 Intraductal carcinoma in situ of right breast: Secondary | ICD-10-CM | POA: Diagnosis not present

## 2021-07-11 ENCOUNTER — Ambulatory Visit
Admission: RE | Admit: 2021-07-11 | Discharge: 2021-07-11 | Disposition: A | Discharge status: Home / Self Care | Payer: BC Managed Care – PPO | Source: Ambulatory Visit | Attending: Radiation Oncology | Admitting: Radiation Oncology

## 2021-07-11 DIAGNOSIS — D0511 Intraductal carcinoma in situ of right breast: Secondary | ICD-10-CM | POA: Diagnosis not present

## 2021-07-12 ENCOUNTER — Other Ambulatory Visit: Payer: Self-pay

## 2021-07-12 ENCOUNTER — Ambulatory Visit
Admission: RE | Admit: 2021-07-12 | Discharge: 2021-07-12 | Disposition: A | Discharge status: Home / Self Care | Payer: BC Managed Care – PPO | Source: Ambulatory Visit | Attending: Radiation Oncology | Admitting: Radiation Oncology

## 2021-07-12 DIAGNOSIS — D0511 Intraductal carcinoma in situ of right breast: Secondary | ICD-10-CM | POA: Diagnosis not present

## 2021-07-13 ENCOUNTER — Ambulatory Visit
Admission: RE | Admit: 2021-07-13 | Discharge: 2021-07-13 | Disposition: A | Discharge status: Home / Self Care | Payer: BC Managed Care – PPO | Source: Ambulatory Visit | Attending: Radiation Oncology | Admitting: Radiation Oncology

## 2021-07-13 DIAGNOSIS — D0511 Intraductal carcinoma in situ of right breast: Secondary | ICD-10-CM | POA: Diagnosis not present

## 2021-07-16 ENCOUNTER — Other Ambulatory Visit: Payer: Self-pay

## 2021-07-16 ENCOUNTER — Ambulatory Visit
Admission: RE | Admit: 2021-07-16 | Discharge: 2021-07-16 | Disposition: A | Discharge status: Home / Self Care | Payer: BC Managed Care – PPO | Source: Ambulatory Visit | Attending: Radiation Oncology | Admitting: Radiation Oncology

## 2021-07-16 DIAGNOSIS — D0511 Intraductal carcinoma in situ of right breast: Secondary | ICD-10-CM | POA: Diagnosis not present

## 2021-07-17 ENCOUNTER — Ambulatory Visit
Admission: RE | Admit: 2021-07-17 | Discharge: 2021-07-17 | Disposition: A | Discharge status: Home / Self Care | Payer: BC Managed Care – PPO | Source: Ambulatory Visit | Attending: Radiation Oncology | Admitting: Radiation Oncology

## 2021-07-17 DIAGNOSIS — D0511 Intraductal carcinoma in situ of right breast: Secondary | ICD-10-CM | POA: Diagnosis not present

## 2021-07-18 ENCOUNTER — Other Ambulatory Visit: Payer: Self-pay

## 2021-07-18 ENCOUNTER — Ambulatory Visit
Admission: RE | Admit: 2021-07-18 | Discharge: 2021-07-18 | Disposition: A | Discharge status: Home / Self Care | Payer: BC Managed Care – PPO | Source: Ambulatory Visit | Attending: Radiation Oncology | Admitting: Radiation Oncology

## 2021-07-18 DIAGNOSIS — D0511 Intraductal carcinoma in situ of right breast: Secondary | ICD-10-CM | POA: Diagnosis not present

## 2021-07-19 ENCOUNTER — Ambulatory Visit
Admission: RE | Admit: 2021-07-19 | Discharge: 2021-07-19 | Disposition: A | Discharge status: Home / Self Care | Payer: BC Managed Care – PPO | Source: Ambulatory Visit | Attending: Radiation Oncology | Admitting: Radiation Oncology

## 2021-07-19 DIAGNOSIS — D0511 Intraductal carcinoma in situ of right breast: Secondary | ICD-10-CM | POA: Diagnosis not present

## 2021-07-20 ENCOUNTER — Other Ambulatory Visit: Payer: Self-pay

## 2021-07-20 ENCOUNTER — Ambulatory Visit
Admission: RE | Admit: 2021-07-20 | Discharge: 2021-07-20 | Disposition: A | Discharge status: Home / Self Care | Payer: BC Managed Care – PPO | Source: Ambulatory Visit | Attending: Radiation Oncology | Admitting: Radiation Oncology

## 2021-07-20 DIAGNOSIS — D0511 Intraductal carcinoma in situ of right breast: Secondary | ICD-10-CM | POA: Diagnosis not present

## 2021-07-23 ENCOUNTER — Ambulatory Visit
Admission: RE | Admit: 2021-07-23 | Discharge: 2021-07-23 | Disposition: A | Discharge status: Home / Self Care | Payer: BC Managed Care – PPO | Source: Ambulatory Visit | Attending: Radiation Oncology | Admitting: Radiation Oncology

## 2021-07-23 ENCOUNTER — Other Ambulatory Visit: Payer: Self-pay

## 2021-07-23 DIAGNOSIS — D0511 Intraductal carcinoma in situ of right breast: Secondary | ICD-10-CM | POA: Diagnosis not present

## 2021-07-24 ENCOUNTER — Ambulatory Visit
Admission: RE | Admit: 2021-07-24 | Discharge: 2021-07-24 | Disposition: A | Discharge status: Home / Self Care | Payer: BC Managed Care – PPO | Source: Ambulatory Visit | Attending: Radiation Oncology | Admitting: Radiation Oncology

## 2021-07-24 DIAGNOSIS — D0511 Intraductal carcinoma in situ of right breast: Secondary | ICD-10-CM | POA: Diagnosis not present

## 2021-07-25 ENCOUNTER — Other Ambulatory Visit: Payer: Self-pay

## 2021-07-25 ENCOUNTER — Ambulatory Visit
Admission: RE | Admit: 2021-07-25 | Discharge: 2021-07-25 | Disposition: A | Discharge status: Home / Self Care | Payer: BC Managed Care – PPO | Source: Ambulatory Visit | Attending: Radiation Oncology | Admitting: Radiation Oncology

## 2021-07-25 DIAGNOSIS — D0511 Intraductal carcinoma in situ of right breast: Secondary | ICD-10-CM | POA: Diagnosis not present

## 2021-07-26 ENCOUNTER — Ambulatory Visit
Admission: RE | Admit: 2021-07-26 | Discharge: 2021-07-26 | Disposition: A | Discharge status: Home / Self Care | Payer: BC Managed Care – PPO | Source: Ambulatory Visit | Attending: Radiation Oncology | Admitting: Radiation Oncology

## 2021-07-26 DIAGNOSIS — D0511 Intraductal carcinoma in situ of right breast: Secondary | ICD-10-CM | POA: Diagnosis not present

## 2021-07-27 ENCOUNTER — Ambulatory Visit
Admission: RE | Admit: 2021-07-27 | Discharge: 2021-07-27 | Disposition: A | Discharge status: Home / Self Care | Payer: BC Managed Care – PPO | Source: Ambulatory Visit | Attending: Radiation Oncology | Admitting: Radiation Oncology

## 2021-07-27 DIAGNOSIS — D0511 Intraductal carcinoma in situ of right breast: Secondary | ICD-10-CM | POA: Diagnosis not present

## 2021-07-31 ENCOUNTER — Ambulatory Visit
Admission: RE | Admit: 2021-07-31 | Discharge: 2021-07-31 | Disposition: A | Discharge status: Home / Self Care | Payer: BC Managed Care – PPO | Source: Ambulatory Visit | Attending: Radiation Oncology | Admitting: Radiation Oncology

## 2021-07-31 ENCOUNTER — Other Ambulatory Visit: Payer: Self-pay

## 2021-07-31 DIAGNOSIS — D0511 Intraductal carcinoma in situ of right breast: Secondary | ICD-10-CM | POA: Diagnosis not present

## 2021-08-01 ENCOUNTER — Ambulatory Visit
Admission: RE | Admit: 2021-08-01 | Discharge: 2021-08-01 | Disposition: A | Discharge status: Home / Self Care | Payer: BC Managed Care – PPO | Source: Ambulatory Visit | Attending: Radiation Oncology | Admitting: Radiation Oncology

## 2021-08-01 DIAGNOSIS — D0511 Intraductal carcinoma in situ of right breast: Secondary | ICD-10-CM

## 2021-08-01 MED ORDER — RADIAPLEXRX EX GEL: Freq: Once | CUTANEOUS | Status: AC

## 2021-08-02 ENCOUNTER — Ambulatory Visit
Admission: RE | Admit: 2021-08-02 | Discharge: 2021-08-02 | Disposition: A | Discharge status: Home / Self Care | Payer: BC Managed Care – PPO | Source: Ambulatory Visit | Attending: Radiation Oncology | Admitting: Radiation Oncology

## 2021-08-02 ENCOUNTER — Other Ambulatory Visit: Payer: Self-pay

## 2021-08-02 DIAGNOSIS — D0511 Intraductal carcinoma in situ of right breast: Secondary | ICD-10-CM | POA: Diagnosis not present

## 2021-08-03 ENCOUNTER — Ambulatory Visit
Admission: RE | Admit: 2021-08-03 | Discharge: 2021-08-03 | Disposition: A | Discharge status: Home / Self Care | Payer: BC Managed Care – PPO | Source: Ambulatory Visit | Attending: Radiation Oncology | Admitting: Radiation Oncology

## 2021-08-03 DIAGNOSIS — D0511 Intraductal carcinoma in situ of right breast: Secondary | ICD-10-CM | POA: Diagnosis not present

## 2021-08-07 ENCOUNTER — Ambulatory Visit
Admission: RE | Admit: 2021-08-07 | Discharge: 2021-08-07 | Disposition: A | Discharge status: Home / Self Care | Payer: BC Managed Care – PPO | Source: Ambulatory Visit | Attending: Radiation Oncology | Admitting: Radiation Oncology

## 2021-08-07 DIAGNOSIS — D0511 Intraductal carcinoma in situ of right breast: Secondary | ICD-10-CM | POA: Insufficient documentation

## 2021-08-08 ENCOUNTER — Ambulatory Visit
Admission: RE | Admit: 2021-08-08 | Discharge: 2021-08-08 | Disposition: A | Discharge status: Home / Self Care | Payer: BC Managed Care – PPO | Source: Ambulatory Visit | Attending: Radiation Oncology | Admitting: Radiation Oncology

## 2021-08-08 DIAGNOSIS — D0511 Intraductal carcinoma in situ of right breast: Secondary | ICD-10-CM | POA: Diagnosis not present

## 2021-08-09 ENCOUNTER — Ambulatory Visit
Admission: RE | Admit: 2021-08-09 | Discharge: 2021-08-09 | Disposition: A | Discharge status: Home / Self Care | Payer: BC Managed Care – PPO | Source: Ambulatory Visit | Attending: Radiation Oncology | Admitting: Radiation Oncology

## 2021-08-09 DIAGNOSIS — D0511 Intraductal carcinoma in situ of right breast: Secondary | ICD-10-CM | POA: Diagnosis not present

## 2021-08-10 ENCOUNTER — Ambulatory Visit: Payer: BC Managed Care – PPO | Admitting: Radiation Oncology

## 2021-08-10 ENCOUNTER — Ambulatory Visit
Admission: RE | Admit: 2021-08-10 | Discharge: 2021-08-10 | Disposition: A | Discharge status: Home / Self Care | Payer: BC Managed Care – PPO | Source: Ambulatory Visit | Attending: Radiation Oncology | Admitting: Radiation Oncology

## 2021-08-10 DIAGNOSIS — D0511 Intraductal carcinoma in situ of right breast: Secondary | ICD-10-CM | POA: Diagnosis not present

## 2021-08-13 ENCOUNTER — Ambulatory Visit
Admission: RE | Admit: 2021-08-13 | Discharge: 2021-08-13 | Disposition: A | Discharge status: Home / Self Care | Payer: BC Managed Care – PPO | Source: Ambulatory Visit | Attending: Radiation Oncology | Admitting: Radiation Oncology

## 2021-08-13 ENCOUNTER — Other Ambulatory Visit: Payer: Self-pay

## 2021-08-13 DIAGNOSIS — D0511 Intraductal carcinoma in situ of right breast: Secondary | ICD-10-CM | POA: Diagnosis not present

## 2021-08-14 ENCOUNTER — Ambulatory Visit
Admission: RE | Admit: 2021-08-14 | Discharge: 2021-08-14 | Disposition: A | Discharge status: Home / Self Care | Payer: BC Managed Care – PPO | Source: Ambulatory Visit | Attending: Radiation Oncology | Admitting: Radiation Oncology

## 2021-08-14 DIAGNOSIS — D0511 Intraductal carcinoma in situ of right breast: Secondary | ICD-10-CM | POA: Diagnosis not present

## 2021-08-15 ENCOUNTER — Ambulatory Visit
Admission: RE | Admit: 2021-08-15 | Discharge: 2021-08-15 | Disposition: A | Discharge status: Home / Self Care | Payer: BC Managed Care – PPO | Source: Ambulatory Visit | Attending: Radiation Oncology | Admitting: Radiation Oncology

## 2021-08-15 ENCOUNTER — Other Ambulatory Visit: Payer: Self-pay

## 2021-08-15 DIAGNOSIS — D0511 Intraductal carcinoma in situ of right breast: Secondary | ICD-10-CM | POA: Diagnosis not present

## 2021-08-16 ENCOUNTER — Ambulatory Visit
Admission: RE | Admit: 2021-08-16 | Discharge: 2021-08-16 | Disposition: A | Discharge status: Home / Self Care | Payer: BC Managed Care – PPO | Source: Ambulatory Visit | Attending: Radiation Oncology | Admitting: Radiation Oncology

## 2021-08-16 ENCOUNTER — Encounter: Payer: Self-pay | Admitting: *Deleted

## 2021-08-16 ENCOUNTER — Other Ambulatory Visit: Payer: Self-pay | Admitting: *Deleted

## 2021-08-16 DIAGNOSIS — D0511 Intraductal carcinoma in situ of right breast: Secondary | ICD-10-CM | POA: Diagnosis not present

## 2021-08-17 ENCOUNTER — Other Ambulatory Visit: Payer: Self-pay

## 2021-08-17 ENCOUNTER — Encounter: Payer: Self-pay | Admitting: Radiation Oncology

## 2021-08-17 ENCOUNTER — Ambulatory Visit
Admission: RE | Admit: 2021-08-17 | Discharge: 2021-08-17 | Disposition: A | Discharge status: Home / Self Care | Payer: BC Managed Care – PPO | Source: Ambulatory Visit | Attending: Radiation Oncology | Admitting: Radiation Oncology

## 2021-08-17 ENCOUNTER — Telehealth: Payer: Self-pay | Admitting: Hematology and Oncology

## 2021-08-17 DIAGNOSIS — D0511 Intraductal carcinoma in situ of right breast: Secondary | ICD-10-CM | POA: Diagnosis not present

## 2021-08-17 NOTE — Telephone Encounter (Signed)
Sch per 1/12 inbasket,pt aware °

## 2021-08-20 NOTE — Progress Notes (Signed)
° °                                                                                                                                                          °  Patient Name: Kathy Hickman MRN: [id number] DOB: 1980-10-11 Referring Physician: Lurline Del (Profile Not Attached) Date of Service: 08/17/2021 [facility], [place]                                                        End Of Treatment Note  Diagnoses: D05.11-Intraductal carcinoma in situ of right breast  Cancer Staging: Intermediate grade, ER/PR positive DCIS of the right breast  Intent: Curative  Radiation Treatment Dates: 07/02/2021 through 08/17/2021 Site Technique Total Dose (Gy) Dose per Fx (Gy) Completed Fx Beam Energies  Breast, Right: Breast_Rt 3D 50.4/50.4 1.8 28/28 6XFFF  Breast, Right: Breast_Rt_Bst 3D 10/10 2 5/5 6X   Narrative: The patient tolerated radiation therapy relatively well. She developed fatigue and anticipated skin changes in the treatment field.   Plan: The patient will receive a call in about one month from the radiation oncology department. She will continue follow up with Dr. Chryl Heck as well.  ________________________________________________    Carola Rhine, [facility]

## 2021-08-31 ENCOUNTER — Encounter: Payer: Self-pay | Admitting: Hematology and Oncology

## 2021-08-31 ENCOUNTER — Inpatient Hospital Stay: Payer: BC Managed Care – PPO | Attending: Oncology | Admitting: Hematology and Oncology

## 2021-08-31 ENCOUNTER — Other Ambulatory Visit: Payer: Self-pay

## 2021-08-31 ENCOUNTER — Inpatient Hospital Stay: Payer: BC Managed Care – PPO | Admitting: Hematology and Oncology

## 2021-08-31 VITALS — BP 125/54 | HR 76 | Temp 97.5°F | Resp 18 | Ht 64.0 in | Wt 238.3 lb

## 2021-08-31 DIAGNOSIS — Z923 Personal history of irradiation: Secondary | ICD-10-CM | POA: Diagnosis not present

## 2021-08-31 DIAGNOSIS — L819 Disorder of pigmentation, unspecified: Secondary | ICD-10-CM | POA: Insufficient documentation

## 2021-08-31 DIAGNOSIS — D0511 Intraductal carcinoma in situ of right breast: Secondary | ICD-10-CM | POA: Diagnosis present

## 2021-08-31 MED ORDER — TAMOXIFEN CITRATE 20 MG PO TABS: 20.0000 mg | ORAL_TABLET | Freq: Every day | ORAL | 5 refills | Status: DC

## 2021-08-31 NOTE — Progress Notes (Signed)
Kathy Hickman  [phone] [phone]    ID: Kathy Hickman DOB: 1981-01-12  MR#: [id number]  OJJ#:[id number]  Patient Care Team: Donnajean Lopes, MD as PCP - General (Internal Medicine) Mauro Kaufmann, RN as Oncology Nurse Navigator Rockwell Germany, RN as Oncology Nurse Navigator Erroll Luna, MD as Consulting Physician (General Surgery) Magrinat, Virgie Dad, MD as Consulting Physician (Oncology) Kyung Rudd, MD as Consulting Physician (Radiation Oncology) Armandina Stammer, DO as Consulting Physician (Obstetrics and Gynecology) Benay Pike, MD OTHER MD:  CHIEF COMPLAINT: noninvasive breast cancer, estrogen receptor positive  CURRENT TREATMENT:   HISTORY OF CURRENT ILLNESS: "Kathy Hickman" had her first ever screening mammography on 03/21/2021 showing a possible abnormality in the right breast. She underwent right diagnostic mammography with tomography and right breast ultrasonography at [facility] on 04/11/2021 showing: breast density category C; 0.8 cm irregular mass and associated distortion in right breast at 10 o'clock; no axillary lymphadenopathy.  Accordingly on 04/17/2021 she proceeded to biopsy of the right breast area in question. 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 Prognostic indicators significant for: estrogen receptor, 85% positive and progesterone receptor, 10% positive, both with strong staining intensity.    Cancer Staging  Ductal carcinoma in situ (DCIS) of right breast Staging form: Breast, AJCC 8th Edition - Clinical stage from 04/25/2021: Stage 0 (cTis (DCIS), cN0, cM0, ER+, PR+, HER2: Not Assessed) - Unsigned Stage prefix: Initial diagnosis Nuclear grade: G2 Laterality: Right Staged by: Pathologist and managing physician Stage used in treatment planning: Yes National guidelines used in treatment  planning: Yes Type of national guideline used in treatment planning: NCCN  Patient underwent right breast seed localized lumpectomy on 05/10/2021 for right breast ductal carcinoma in situ. Surgical pathology showed residual complex sclerosing lesion with florid usual ductal hyperplasia, no residual ductal carcinoma identified.  She is now undergoing adjuvant radiation and is tolerating it very well.  INTERVAL HISTORY:  She is here for a follow up after adjuvant radiation to start anti estrogen therapy. She has recovered well from radiation.   No ongoing issues except for hyperpigmentation of the right breast.   She remembers talking about antiestrogen therapy with other providers and is willing to continue on it.   Rest of the pertinent 10 point ROS reviewed and negative.   REVIEW OF SYSTEMS:   COVID 19 VACCINATION STATUS: not vaccinated as of September 2022; has not had COVID   PAST MEDICAL HISTORY: Past Medical History:  Diagnosis Date   Allergy    Anxiety    Arthritis    osteoarthritis   Asthma    Carpal tunnel syndrome    Depression    Fibromyalgia    Hypertension    Pain, chronic    Pneumonia     PAST SURGICAL HISTORY: Past Surgical History:  Procedure Laterality Date   BREAST LUMPECTOMY WITH RADIOACTIVE SEED LOCALIZATION Right 05/10/2021   Procedure: RIGHT BREAST LUMPECTOMY WITH RADIOACTIVE SEED LOCALIZATION;  Surgeon: Erroll Luna, MD;  Location: [place];  Service: General;  Laterality: Right;   [place]  08/05/2000   Dr. Ruthann Cancer    FAMILY HISTORY: Family History  Problem Relation Age of Onset   Anxiety disorder Mother    Breast cancer Paternal Aunt 69   Colon cancer Paternal Aunt 36   Pancreatic cancer Maternal Grandmother 91   Lung cancer Paternal  Grandfather        dx. 50s, smoked   Asthma Son    Allergies Son    Her father died at age 68 from alcohol, drug issues. Her mother is currently 69 years  old (as of 04/2021). Kathy Hickman has one brother (and no sisters). She reports lung cancer in her paternal grandfather in his 67's, breast cancer in a paternal aunt at age 49, colon cancer in a different paternal aunt at age 37, and pancreatic cancer in her maternal grandmother at age 45.   GYNECOLOGIC HISTORY:  No LMP recorded. Menarche: 41 years old Age at first live birth: 41 years old [place] P 1 LMP 04/07/2021 Contraceptive used "on and off for past 15 years" HRT n/a  Hysterectomy? no BSO? no   SOCIAL HISTORY: (updated 04/2021)  Blayne is currently working for [employer], where she has worked for the last 20 years. She is single (divorced). She lives at home with her mother, who cleans houses part-time. Son Kathy Hickman, age 79, works in Stage manager in [place] with his father. Anjana is not a Designer, fashion/clothing.    ADVANCED DIRECTIVES: not in place   HEALTH MAINTENANCE: Social History   Tobacco Use   Smoking status: Never   Smokeless tobacco: Never  Vaping Use   Vaping Use: Never used  Substance Use Topics   Alcohol use: No   Drug use: No    Comment: narcotic dependence     Colonoscopy: 04/2009 (Dr. Sharlett Iles)  PAP: 03/2021  Bone density: unsure   Allergies  Allergen Reactions   Amoxicillin Hives, Shortness Of Breath and Swelling   Sulfonamide Derivatives Hives, Shortness Of Breath and Swelling   Latex Rash    Current Outpatient Medications  Medication Sig Dispense Refill   ALPRAZolam (XANAX) 0.5 MG tablet Take 0.5 mg by mouth daily.     Ascorbic Acid (VITAMIN C) 1000 MG tablet Take 1,000 mg by mouth daily.     atenolol (TENORMIN) 50 MG tablet Take 50 mg by mouth daily.     cetirizine-pseudoephedrine (ZYRTEC-D) 5-120 MG per tablet Take 1 tablet by mouth daily. allergies     Cholecalciferol (D3-1000 PO) Take by mouth.     ciprofloxacin (CIPRO) 500 MG tablet Take 1 tablet (500 mg total) by mouth 2 (two) times daily. 20 tablet 0   clomiPRAMINE (ANAFRANIL) 25 MG  capsule 25 mg.     Cod Liver Oil CAPS Take by mouth.     docusate sodium (COLACE) 100 MG capsule Take 1 capsule by mouth every other day.     ferrous gluconate (FERGON) 240 (27 FE) MG tablet 1 tablet daily.     fluticasone (FLONASE) 50 MCG/ACT nasal spray Place 1 spray into both nostrils daily.     gabapentin (NEURONTIN) 300 MG capsule Take 2 capsules by mouth in the morning and at bedtime.     HYDROmorphone HCl ER 16 MG TB24 Take 1 tablet by mouth daily at 4 PM.     ibuprofen (ADVIL) 200 MG tablet Take 200 mg by mouth every 6 (six) hours as needed.     ibuprofen (ADVIL) 800 MG tablet Take 1 tablet (800 mg total) by mouth every 8 (eight) hours as needed. 30 tablet 0   LYSINE PO Take by mouth.     montelukast (SINGULAIR) 10 MG tablet Take 10 mg by mouth daily.     Multiple Vitamins-Minerals (MULTIVITAMINS THER. W/MINERALS) TABS Take 1 tablet by mouth daily.       oxyCODONE-acetaminophen (PERCOCET) 10-325 MG tablet  Take 1 tablet by mouth every 4 (four) hours as needed for pain. Takes 5 x/day     SUMAtriptan (IMITREX) 100 MG tablet Take 100 mg by mouth every 2 (two) hours as needed.       topiramate (TOPAMAX) 25 MG tablet Take 2 tablets by mouth daily.     No current facility-administered medications for this visit.    OBJECTIVE: White woman who appears stated age  There were no vitals filed for this visit.    There is no height or weight on file to calculate BMI.   Wt Readings from Last 3 Encounters:  06/07/21 226 lb (102.5 kg)  05/10/21 224 lb 13.9 oz (102 kg)  04/25/21 227 lb 14.4 oz (103.4 kg)      ECOG FS:1 - Symptomatic but completely ambulatory  Ocular: Sclerae unicteric, pupils round and equal Ear-nose-throat: Wearing a mask Lymphatic: No cervical or supraclavicular adenopathy Lungs no rales or rhonchi Heart regular rate and rhythm Abd soft, nontender, positive bowel sounds MSK no focal spinal tenderness, no joint edema Neuro: non-focal, well-oriented, appropriate  affect Breasts: Right breast postradiation changes.   LAB RESULTS:  CMP     Component Value Date/Time   NA 140 04/25/2021 0828   K 4.6 04/25/2021 0828   CL 110 04/25/2021 0828   CO2 21 (L) 04/25/2021 0828   GLUCOSE 94 04/25/2021 0828   BUN 20 04/25/2021 0828   CREATININE 0.95 04/25/2021 0828   CALCIUM 9.1 04/25/2021 0828   PROT 7.2 04/25/2021 0828   ALBUMIN 4.3 04/25/2021 0828   AST 17 04/25/2021 0828   ALT 19 04/25/2021 0828   ALKPHOS 70 04/25/2021 0828   BILITOT 0.3 04/25/2021 0828   GFRNONAA >60 04/25/2021 0828   GFRAA >90 07/15/2011 0436    No results found for: TOTALPROTELP, ALBUMINELP, A1GS, A2GS, BETS, BETA2SER, GAMS, MSPIKE, SPEI  Lab Results  Component Value Date   WBC 7.4 04/25/2021   NEUTROABS 3.5 04/25/2021   HGB 12.9 04/25/2021   HCT 38.4 04/25/2021   MCV 87.1 04/25/2021   PLT 279 04/25/2021    No results found for: LABCA2  No components found for: MHDQQI297  No results for input(s): INR in the last 168 hours.  No results found for: LABCA2  No results found for: LGX211  No results found for: HER740  No results found for: CXK481  No results found for: CA2729  No components found for: HGQUANT  No results found for: CEA1 / No results found for: CEA1   No results found for: AFPTUMOR  No results found for: CHROMOGRNA  No results found for: KPAFRELGTCHN, LAMBDASER, KAPLAMBRATIO (kappa/lambda light chains)  No results found for: HGBA, HGBA2QUANT, HGBFQUANT, HGBSQUAN (Hemoglobinopathy evaluation)   No results found for: LDH  Lab Results  Component Value Date   IRON <10 (L) 07/14/2011   TIBC Not calculated due to Iron <10. 07/14/2011   IRONPCTSAT Not calculated due to Iron <10. 07/14/2011   (Iron and TIBC)  Lab Results  Component Value Date   FERRITIN 116 07/14/2011    Urinalysis    Component Value Date/Time   COLORURINE YELLOW 03/17/2011 [place] 03/17/2011 1517   LABSPEC 1.009 03/17/2011 1517   PHURINE  6.5 03/17/2011 [place] 03/17/2011 1517   HGBUR NEGATIVE 03/17/2011 1517   BILIRUBINUR NEGATIVE 03/17/2011 [place] 03/17/2011 1517   PROTEINUR NEGATIVE 03/17/2011 1517   UROBILINOGEN 0.2 03/17/2011 1517   NITRITE NEGATIVE 03/17/2011 1517   LEUKOCYTESUR  NEGATIVE 03/17/2011 1517    STUDIES: No results found.  ELIGIBLE FOR AVAILABLE RESEARCH PROTOCOL: no  ASSESSMENT: 41 y.o. [place] woman status post a right breast biopsy on 04/17/2021 for ductal carcinoma in situ, grade 2, with possible microinvasion; estrogen and progesterone receptor positive.  She underwent definitive surgery, right breast lumpectomy and no evidence of residual DCIS on final pathology.  She completed adjuvant radiation on August 17, 2021.  She tolerated it well.  She is here to discuss about antiestrogen therapy. Since she is premenopausal we have discussed about tamoxifen which is a selective estrogen receptor modulator.  We have discussed about mechanism of action of tamoxifen, adverse effects of tamoxifen including but not limited to postmenopausal symptoms, increased risk of DVT/PE, increased risk of endometrial cancer, questionably increased risk of cardiovascular events.  We have discussed that the risk of DVT/PE and endometrial cancer is only 1 to 2%.  Benefit from tamoxifen would be improvement in bone density.  At this time I believe the benefits of tamoxifen overweigh the risks for this patient hence she is willing to proceed with that.  She understands that tamoxifen decreases the risk of breast cancer in both breasts as well as distant metastasis.  She will start tamoxifen as soon as possible and return to clinic for follow-up in about 3 months for televisit She will continue mammogram annually, next one due in September 2023.  Thank you for consulting [place] in the care of this patient.  Please do not hesitate to contact us with any additional questions or concerns.  Total time  spent: 40 minutes  *Total Encounter Time as defined by the Centers for Medicare and Medicaid Services includes, in addition to the face-to-face time of a patient visit (documented in the note above) non-face-to-face time: obtaining and reviewing outside history, ordering and reviewing medications, tests or procedures, care coordination (communications with other health care professionals or caregivers) and documentation in the medical record.

## 2021-09-17 ENCOUNTER — Ambulatory Visit
Admission: RE | Admit: 2021-09-17 | Discharge: 2021-09-17 | Disposition: A | Discharge status: Home / Self Care | Payer: BC Managed Care – PPO | Source: Ambulatory Visit | Attending: Radiation Oncology | Admitting: Radiation Oncology

## 2021-09-17 DIAGNOSIS — D0511 Intraductal carcinoma in situ of right breast: Secondary | ICD-10-CM | POA: Insufficient documentation

## 2021-09-17 NOTE — Progress Notes (Signed)
°  Radiation Oncology         [phone] ________________________________  Name: Kathy Hickman MRN: [id number]  Date of Service: 09/17/2021  DOB: 1980-10-25  Post Treatment Telephone Note  Diagnosis:   Intermediate grade, ER/PR positive DCIS of the right breast  07/02/2021 through 08/17/2021 Site Technique Total Dose (Gy) Dose per Fx (Gy) Completed Fx Beam Energies  Breast, Right: Breast_Rt 3D 50.4/50.4 1.8 28/28 6XFFF  Breast, Right: Breast_Rt_Bst 3D 10/10 2 5/5 6X   Narrative: The patient tolerated radiation therapy relatively well. She developed fatigue and anticipated skin changes in the treatment field.   Impression/Plan: 1. 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 I was unable to reach the patient but left a voicemail and on the message, I discussed that we would be happy to continue to follow her as needed, but she will also continue to follow up with Dr. Chryl Heck in medical oncology. She was counseled on skin care as well as measures to avoid sun exposure to this area.        Carola Rhine, PAC

## 2021-09-21 ENCOUNTER — Telehealth: Payer: Self-pay | Admitting: *Deleted

## 2021-09-27 ENCOUNTER — Encounter (HOSPITAL_COMMUNITY): Payer: Self-pay

## 2021-10-09 ENCOUNTER — Telehealth: Payer: Self-pay | Admitting: *Deleted

## 2021-11-29 ENCOUNTER — Inpatient Hospital Stay: Payer: BC Managed Care – PPO | Admitting: Hematology and Oncology

## 2021-11-29 ENCOUNTER — Encounter: Payer: Self-pay | Admitting: Hematology and Oncology

## 2021-11-29 DIAGNOSIS — D0511 Intraductal carcinoma in situ of right breast: Secondary | ICD-10-CM | POA: Diagnosis not present

## 2021-11-29 NOTE — Progress Notes (Signed)
?[place]  ?[phone] [phone]  ? ? ?ID: Kathy Hickman DOB: [birth year]  MR#: [id number]  VWP#:[id number] ? ?Patient Care Team: ?Donnajean Lopes, MD as PCP - General (Internal Medicine) ?Mauro Kaufmann, RN as Oncology Nurse Navigator ?Rockwell Germany, RN as Oncology Nurse Navigator ?Erroll Luna, MD as Consulting Physician (General Surgery) ?Magrinat, Virgie Dad, MD (Inactive) as Consulting Physician (Oncology) ?Kyung Rudd, MD as Consulting Physician (Radiation Oncology) ?Law, Bonnita Hollow, DO as Consulting Physician (Obstetrics and Gynecology) ?Benay Pike, MD ?OTHER MD: ? ?CHIEF COMPLAINT: noninvasive breast cancer, estrogen receptor positive ? ?CURRENT TREATMENT:  ? ?HISTORY OF CURRENT ILLNESS: ?"Kathy Hickman" had her first ever screening mammography on 03/21/2021 showing a possible abnormality in the right breast. She underwent right diagnostic mammography with tomography and right breast ultrasonography at [facility] on 04/11/2021 showing: breast density category C; 0.8 cm irregular mass and associated distortion in right breast at 10 o'clock; no axillary lymphadenopathy. ? ?Accordingly on 04/17/2021 she proceeded to biopsy of the right breast area in question. The pathology from this procedure ([id number]) showed: intermediate grade ductal carcinoma in situ arising in a complex sclerosing lesion, invasion is not entirely excluded. Prognostic indicators significant for: estrogen receptor, 85% positive and progesterone receptor, 10% positive, both with strong staining intensity.  ? ? Cancer Staging  ?Ductal carcinoma in situ (DCIS) of right breast ?Staging form: Breast, AJCC 8th Edition ?- Clinical stage from 04/25/2021: Stage 0 (cTis (DCIS), cN0, cM0, ER+, PR+, HER2: Not Assessed) - Unsigned ?Stage prefix: Initial diagnosis ?Nuclear grade: G2 ?Laterality: Right ?Staged by: Pathologist and managing physician ?Stage used in treatment planning: Yes ?National guidelines used in  treatment planning: Yes ?Type of national guideline used in treatment planning: NCCN ? ?Patient underwent right breast seed localized lumpectomy on 05/10/2021 for right breast ductal carcinoma in situ. ?Surgical pathology showed residual complex sclerosing lesion with florid usual ductal hyperplasia, no residual ductal carcinoma identified.   ?She completed adjuvant radiation ?She is on antiestrogen therapy with tamoxifen. ? ?INTERVAL HISTORY: ? ?She is here for a phone follow up on tamoxifen. She says she is having lot of side effects from tamoxifen. ?She has noticed leg cramps, coughing spell, hot flashes, headaches and hasn't been feeling well at all. ?She hasnt felt any new lumps, feels some pain in the area of radiation. ? ?REVIEW OF SYSTEMS: ? ? COVID 19 VACCINATION STATUS: not vaccinated as of September 2022; has not had COVID ? ? ?PAST MEDICAL HISTORY: ?Past Medical History:  ?Diagnosis Date  ? Allergy   ? Anxiety   ? Arthritis   ? osteoarthritis  ? Asthma   ? Carpal tunnel syndrome   ? Depression   ? Fibromyalgia   ? Hypertension   ? Pain, chronic   ? Pneumonia   ? ? ?PAST SURGICAL HISTORY: ?Past Surgical History:  ?Procedure Laterality Date  ? BREAST LUMPECTOMY WITH RADIOACTIVE SEED LOCALIZATION Right 05/10/2021  ? Procedure: RIGHT BREAST LUMPECTOMY WITH RADIOACTIVE SEED LOCALIZATION;  Surgeon: Erroll Luna, MD;  Location: [place];  Service: General;  Laterality: Right;  ? CARPAL TUNNEL RELEASE    ? CESAREAN SECTION  08/05/2000  ? Dr. Ruthann Cancer  ? ? ?FAMILY HISTORY: ?Family History  ?Problem Relation Age of Onset  ? Anxiety disorder Mother   ? Breast cancer Paternal Aunt 27  ? Colon cancer Paternal Aunt 57  ? Pancreatic cancer Maternal Grandmother 79  ? Lung cancer Paternal Grandfather   ?     dx. 21s, smoked  ?  Asthma Son   ? Allergies Son   ? Her father died at age 46 from alcohol, drug issues. Her mother is currently 32 years old (as of 04/2021). Beyonca has one brother (and no  sisters). She reports lung cancer in her paternal grandfather in his 68's, breast cancer in a paternal aunt at age 36, colon cancer in a different paternal aunt at age 42, and pancreatic cancer in her maternal grandmother at age 18. ? ? ?GYNECOLOGIC HISTORY:  ?No LMP recorded. ?Menarche: 41 years old ?Age at first live birth: 41 years old ?GX P 1 ?LMP 04/07/2021 ?Contraceptive used "on and off for past 15 years" ?HRT n/a  ?Hysterectomy? no ?BSO? no ? ? ?SOCIAL HISTORY: (updated 04/2021)  ?Kathy Hickman is currently working for [employer], where she has worked for the last 20 years. She is single (divorced). She lives at home with her mother, who cleans houses part-time. Son [name], age 30, works in Stage manager in [place] with his father. Salihah is not a Designer, fashion/clothing. ?  ? ADVANCED DIRECTIVES: not in place ? ? ?HEALTH MAINTENANCE: ?Social History  ? ?Tobacco Use  ? Smoking status: Never  ? Smokeless tobacco: Never  ?Vaping Use  ? Vaping Use: Never used  ?Substance Use Topics  ? Alcohol use: No  ? Drug use: No  ?  Comment: narcotic dependence  ? ? ? Colonoscopy: 04/2009 (Dr. Sharlett Iles) ? PAP: 03/2021 ? Bone density: unsure ?  ?Allergies  ?Allergen Reactions  ? Amoxicillin Hives, Shortness Of Breath and Swelling  ? Sulfonamide Derivatives Hives, Shortness Of Breath and Swelling  ? Latex Rash  ? ? ?Current Outpatient Medications  ?Medication Sig Dispense Refill  ? ALPRAZolam (XANAX) 0.5 MG tablet Take 0.5 mg by mouth daily.    ? Ascorbic Acid (VITAMIN C) 1000 MG tablet Take 1,000 mg by mouth daily.    ? atenolol (TENORMIN) 50 MG tablet Take 50 mg by mouth daily.    ? cetirizine-pseudoephedrine (ZYRTEC-D) 5-120 MG per tablet Take 1 tablet by mouth daily. allergies    ? Cholecalciferol (D3-1000 PO) Take by mouth.    ? clomiPRAMINE (ANAFRANIL) 25 MG capsule 25 mg.    ? Cod Liver Oil CAPS Take by mouth.    ? docusate sodium (COLACE) 100 MG capsule Take 1 capsule by mouth every other day.    ? ferrous gluconate  (FERGON) 240 (27 FE) MG tablet 1 tablet daily.    ? fluticasone (FLONASE) 50 MCG/ACT nasal spray Place 1 spray into both nostrils daily.    ? gabapentin (NEURONTIN) 300 MG capsule Take 2 capsules by mouth in the morning and at bedtime.    ? HYDROmorphone HCl ER 16 MG TB24 Take 1 tablet by mouth daily at 4 PM.    ? ibuprofen (ADVIL) 200 MG tablet Take 200 mg by mouth every 6 (six) hours as needed.    ? ibuprofen (ADVIL) 800 MG tablet Take 1 tablet (800 mg total) by mouth every 8 (eight) hours as needed. 30 tablet 0  ? LYSINE PO Take by mouth.    ? montelukast (SINGULAIR) 10 MG tablet Take 10 mg by mouth daily.    ? Multiple Vitamins-Minerals (MULTIVITAMINS THER. W/MINERALS) TABS Take 1 tablet by mouth daily.      ? tamoxifen (NOLVADEX) 20 MG tablet Take 1 tablet (20 mg total) by mouth daily. 30 tablet 5  ? tiZANidine (ZANAFLEX) 4 MG capsule Take 4 mg by mouth in the morning and at bedtime.    ? topiramate (  TOPAMAX) 25 MG tablet Take 2 tablets by mouth daily.    ? ?No current facility-administered medications for this visit.  ? ? ?OBJECTIVE: White woman who appears stated age ? ?There were no vitals filed for this visit. ?   There is no height or weight on file to calculate BMI.   ?Wt Readings from Last 3 Encounters:  ?08/31/21 238 lb 4.8 oz (108.1 kg)  ?06/07/21 226 lb (102.5 kg)  ?05/10/21 224 lb 13.9 oz (102 kg)  ? ? ?  ECOG FS:1 - Symptomatic but completely ambulatory ? ?VS and PE not done, telephone visit. ? ?LAB RESULTS: ? ?CMP  ?   ?Component Value Date/Time  ? NA 140 04/25/2021 0828  ? K 4.6 04/25/2021 0828  ? CL 110 04/25/2021 0828  ? CO2 21 (L) 04/25/2021 0289  ? GLUCOSE 94 04/25/2021 0828  ? BUN 20 04/25/2021 0828  ? CREATININE 0.95 04/25/2021 0828  ? CALCIUM 9.1 04/25/2021 0828  ? PROT 7.2 04/25/2021 0828  ? ALBUMIN 4.3 04/25/2021 0828  ? AST 17 04/25/2021 0828  ? ALT 19 04/25/2021 0828  ? ALKPHOS 70 04/25/2021 0828  ? BILITOT 0.3 04/25/2021 0828  ? GFRNONAA >60 04/25/2021 0828  ? GFRAA >90 07/15/2011 0436   ? ? ?No results found for: TOTALPROTELP, ALBUMINELP, A1GS, A2GS, BETS, BETA2SER, GAMS, MSPIKE, SPEI ? ?Lab Results  ?Component Value Date  ? WBC 7.4 04/25/2021  ? NEUTROABS 3.5 04/25/2021  ? HGB 12.9 09/21/202

## 2021-12-27 ENCOUNTER — Telehealth: Payer: Self-pay | Admitting: Hematology and Oncology

## 2021-12-27 NOTE — Telephone Encounter (Signed)
Rescheduled appointment per providers template. Left message.  ? ?

## 2022-02-21 ENCOUNTER — Ambulatory Visit: Payer: BC Managed Care – PPO | Admitting: Hematology and Oncology

## 2022-02-26 ENCOUNTER — Encounter: Payer: Self-pay | Admitting: Hematology and Oncology

## 2022-02-28 ENCOUNTER — Encounter: Payer: Self-pay | Admitting: Hematology and Oncology

## 2022-02-28 ENCOUNTER — Other Ambulatory Visit: Payer: Self-pay

## 2022-02-28 ENCOUNTER — Inpatient Hospital Stay: Payer: BC Managed Care – PPO | Attending: Hematology and Oncology | Admitting: Hematology and Oncology

## 2022-02-28 VITALS — BP 96/50 | HR 88 | Temp 97.5°F | Resp 18 | Ht 64.0 in | Wt 237.7 lb

## 2022-02-28 DIAGNOSIS — D0511 Intraductal carcinoma in situ of right breast: Secondary | ICD-10-CM | POA: Insufficient documentation

## 2022-02-28 DIAGNOSIS — Z79810 Long term (current) use of selective estrogen receptor modulators (SERMs): Secondary | ICD-10-CM | POA: Insufficient documentation

## 2022-02-28 DIAGNOSIS — Z170 Estrogen receptor positive status [ER+]: Secondary | ICD-10-CM | POA: Insufficient documentation

## 2022-02-28 DIAGNOSIS — Z923 Personal history of irradiation: Secondary | ICD-10-CM | POA: Diagnosis not present

## 2022-02-28 DIAGNOSIS — N951 Menopausal and female climacteric states: Secondary | ICD-10-CM | POA: Diagnosis not present

## 2022-02-28 MED ORDER — OXYBUTYNIN CHLORIDE 5 MG PO TABS: 2.5000 mg | ORAL_TABLET | Freq: Every day | ORAL | 2 refills | Status: AC

## 2022-02-28 NOTE — Progress Notes (Signed)
[place]  Telephone:(336) [phone] Fax:(336) [phone]    ID: JAPLEEN TORNOW DOB: 18-Apr-1981  MR#: [id number]  EFE#:[id number]  Patient Care Team: Donnajean Lopes, MD as PCP - General (Internal Medicine) Mauro Kaufmann, RN as Oncology Nurse Navigator Rockwell Germany, RN as Oncology Nurse Navigator Erroll Luna, MD as Consulting Physician (General Surgery) Magrinat, Virgie Dad, MD (Inactive) as Consulting Physician (Oncology) Kyung Rudd, MD as Consulting Physician (Radiation Oncology) Armandina Stammer, DO as Consulting Physician (Obstetrics and Gynecology) Benay Pike, MD  CHIEF COMPLAINT: noninvasive breast cancer, estrogen receptor positive  CURRENT TREATMENT:   HISTORY OF CURRENT ILLNESS:  "Shirl" had her first ever screening mammography on 03/21/2021 showing a possible abnormality in the right breast. She underwent right diagnostic mammography with tomography and right breast ultrasonography at [facility] on 04/11/2021 showing: breast density category C; 0.8 cm irregular mass and associated distortion in right breast at 10 o'clock; no axillary lymphadenopathy.  Accordingly on 04/17/2021 she proceeded to biopsy of the right breast area in question. 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 Prognostic indicators significant for: estrogen receptor, 85% positive and progesterone receptor, 10% positive, both with strong staining intensity.    Cancer Staging  Ductal carcinoma in situ (DCIS) of right breast Staging form: Breast, AJCC 8th Edition - Clinical stage from 04/25/2021: Stage 0 (cTis (DCIS), cN0, cM0, ER+, PR+, HER2: Not Assessed) - Unsigned Stage prefix: Initial diagnosis Nuclear grade: G2 Laterality: Right Staged by: Pathologist and managing physician Stage used in treatment planning: Yes National guidelines used in treatment  planning: Yes Type of national guideline used in treatment planning: NCCN  Patient underwent right breast seed localized lumpectomy on 05/10/2021 for right breast ductal carcinoma in situ. Surgical pathology showed residual complex sclerosing lesion with florid usual ductal hyperplasia, no residual ductal carcinoma identified.   She completed adjuvant radiation right She is on antiestrogen therapy with tamoxifen.  INTERVAL HISTORY: She could not tolerate 20 mg of tamoxifen hence dose reduced to 10 mg.  Since she has been taking 10 mg, she continues to have some jitteriness, mood swings and hot flashes but they are much more tolerable.  She has been taking it diligently.  She felt some change in her right breast and was really worried about it.  She is due for mammogram which is to be scheduled. She otherwise denies any new symptoms.  Rest of the pertinent 10 point ROS reviewed and negative.   COVID 19 VACCINATION STATUS: not vaccinated as of September 2022; has not had COVID   PAST MEDICAL HISTORY: Past Medical History:  Diagnosis Date   Allergy    Anxiety    Arthritis    osteoarthritis   Asthma    Carpal tunnel syndrome    Depression    Fibromyalgia    Hypertension    Pain, chronic    Pneumonia     PAST SURGICAL HISTORY: Past Surgical History:  Procedure Laterality Date   BREAST LUMPECTOMY WITH RADIOACTIVE SEED LOCALIZATION Right 05/10/2021   Procedure: RIGHT BREAST LUMPECTOMY WITH RADIOACTIVE SEED LOCALIZATION;  Surgeon: Erroll Luna, MD;  Location: [facility];  Service: General;  Laterality: Right;   CARPAL TUNNEL RELEASE     CESAREAN SECTION  08/05/2000   Dr. Ruthann Cancer    FAMILY HISTORY: Family History  Problem Relation Age of Onset   Anxiety disorder Mother    Breast cancer Paternal  Aunt 6   Colon cancer Paternal Aunt 26   Pancreatic cancer Maternal Grandmother 30   Lung cancer Paternal Grandfather        dx. 39s, smoked   Asthma Son     Allergies Son    Her father died at age 69 from alcohol, drug issues. Her mother is currently 47 years old (as of 04/2021). Takako has one brother (and no sisters). She reports lung cancer in her paternal grandfather in his 72's, breast cancer in a paternal aunt at age 47, colon cancer in a different paternal aunt at age 80, and pancreatic cancer in her maternal grandmother at age 78.   GYNECOLOGIC HISTORY:  No LMP recorded. Menarche: 41 years old Age at first live birth: 41 years old [place] P 1 LMP 04/07/2021 Contraceptive used "on and off for past 15 years" HRT n/a  Hysterectomy? no BSO? no   SOCIAL HISTORY: (updated 04/2021)  Paislee is currently working for [employer], where she has worked for the last 20 years. She is single (divorced). She lives at home with her mother, who cleans houses part-time. Son [name], age 5, works in Stage manager in [place] with his father. Daylen is not a Designer, fashion/clothing.    ADVANCED DIRECTIVES: not in place   HEALTH MAINTENANCE: Social History   Tobacco Use   Smoking status: Never   Smokeless tobacco: Never  Vaping Use   Vaping Use: Never used  Substance Use Topics   Alcohol use: No   Drug use: No    Comment: narcotic dependence     Colonoscopy: 04/2009 (Dr. Sharlett Iles)  PAP: 03/2021  Bone density: unsure   Allergies  Allergen Reactions   Amoxicillin Hives, Shortness Of Breath and Swelling   Sulfonamide Derivatives Hives, Shortness Of Breath and Swelling   Latex Rash    Current Outpatient Medications  Medication Sig Dispense Refill   ALPRAZolam (XANAX) 0.5 MG tablet Take 0.5 mg by mouth daily.     Ascorbic Acid (VITAMIN C) 1000 MG tablet Take 1,000 mg by mouth daily.     atenolol (TENORMIN) 50 MG tablet Take 50 mg by mouth daily.     cetirizine-pseudoephedrine (ZYRTEC-D) 5-120 MG per tablet Take 1 tablet by mouth daily. allergies     Cholecalciferol (D3-1000 PO) Take by mouth.     clomiPRAMINE (ANAFRANIL) 25 MG capsule 25  mg.     Cod Liver Oil CAPS Take by mouth.     docusate sodium (COLACE) 100 MG capsule Take 1 capsule by mouth every other day.     ferrous gluconate (FERGON) 240 (27 FE) MG tablet 1 tablet daily.     fluticasone (FLONASE) 50 MCG/ACT nasal spray Place 1 spray into both nostrils daily.     gabapentin (NEURONTIN) 300 MG capsule Take 2 capsules by mouth in the morning and at bedtime.     HYDROmorphone HCl ER 16 MG TB24 Take 1 tablet by mouth daily at 4 PM.     ibuprofen (ADVIL) 200 MG tablet Take 200 mg by mouth every 6 (six) hours as needed.     ibuprofen (ADVIL) 800 MG tablet Take 1 tablet (800 mg total) by mouth every 8 (eight) hours as needed. 30 tablet 0   LYSINE PO Take by mouth.     montelukast (SINGULAIR) 10 MG tablet Take 10 mg by mouth daily.     Multiple Vitamins-Minerals (MULTIVITAMINS THER. W/MINERALS) TABS Take 1 tablet by mouth daily.       tamoxifen (NOLVADEX) 20 MG tablet Take  1 tablet (20 mg total) by mouth daily. 30 tablet 5   tiZANidine (ZANAFLEX) 4 MG capsule Take 4 mg by mouth in the morning and at bedtime.     topiramate (TOPAMAX) 25 MG tablet Take 2 tablets by mouth daily.     No current facility-administered medications for this visit.    OBJECTIVE: White woman who appears stated age  There were no vitals filed for this visit.    There is no height or weight on file to calculate BMI.   Wt Readings from Last 3 Encounters:  08/31/21 238 lb 4.8 oz (108.1 kg)  06/07/21 226 lb (102.5 kg)  05/10/21 224 lb 13.9 oz (102 kg)      ECOG FS:1 - Symptomatic but completely ambulatory  Physical Exam Constitutional:      Appearance: Normal appearance.  Chest:     Comments: Bilateral breasts inspected.  The area she is feeling is postsurgical changes in the right breast at the lumpectomy scar.  No palpable masses.  No regional adenopathy. Musculoskeletal:     Cervical back: Normal range of motion and neck supple. No rigidity.  Lymphadenopathy:     Cervical: No cervical  adenopathy.  Neurological:     Mental Status: She is alert.      LAB RESULTS:  CMP     Component Value Date/Time   NA 140 04/25/2021 0828   K 4.6 04/25/2021 0828   CL 110 04/25/2021 0828   CO2 21 (L) 04/25/2021 0828   GLUCOSE 94 04/25/2021 0828   BUN 20 04/25/2021 0828   CREATININE 0.95 04/25/2021 0828   CALCIUM 9.1 04/25/2021 0828   PROT 7.2 04/25/2021 0828   ALBUMIN 4.3 04/25/2021 0828   AST 17 04/25/2021 0828   ALT 19 04/25/2021 0828   ALKPHOS 70 04/25/2021 0828   BILITOT 0.3 04/25/2021 0828   GFRNONAA >60 04/25/2021 0828   GFRAA >90 07/15/2011 0436    No results found for: "TOTALPROTELP", "ALBUMINELP", "A1GS", "A2GS", "BETS", "BETA2SER", "GAMS", "MSPIKE", "SPEI"  Lab Results  Component Value Date   WBC 7.4 04/25/2021   NEUTROABS 3.5 04/25/2021   HGB 12.9 04/25/2021   HCT 38.4 04/25/2021   MCV 87.1 04/25/2021   PLT 279 04/25/2021    No results found for: "LABCA2"  No components found for: "LDJTTS177"  No results for input(s): "INR" in the last 168 hours.  No results found for: "LABCA2"  No results found for: "LTJ030"  No results found for: "CAN125"  No results found for: "CAN153"  No results found for: "CA2729"  No components found for: "HGQUANT"  No results found for: "CEA1", "CEA" / No results found for: "CEA1", "CEA"   No results found for: "AFPTUMOR"  No results found for: "CHROMOGRNA"  No results found for: "KPAFRELGTCHN", "LAMBDASER", "KAPLAMBRATIO" (kappa/lambda light chains)  No results found for: "HGBA", "HGBA2QUANT", "HGBFQUANT", "HGBSQUAN" (Hemoglobinopathy evaluation)   No results found for: "LDH"  Lab Results  Component Value Date   IRON <10 (L) 07/14/2011   TIBC Not calculated due to Iron <10. 07/14/2011   IRONPCTSAT Not calculated due to Iron <10. 07/14/2011   (Iron and TIBC)  Lab Results  Component Value Date   FERRITIN 116 07/14/2011    Urinalysis    Component Value Date/Time   COLORURINE YELLOW 03/17/2011  [place] 03/17/2011 1517   LABSPEC 1.009 03/17/2011 1517   PHURINE 6.5 03/17/2011 1517   GLUCOSEU NEGATIVE 03/17/2011 1517   HGBUR NEGATIVE 03/17/2011 [place] 03/17/2011  [place] 03/17/2011 [place] 03/17/2011 1517   UROBILINOGEN 0.2 03/17/2011 1517   NITRITE NEGATIVE 03/17/2011 1517   LEUKOCYTESUR NEGATIVE 03/17/2011 1517    STUDIES: No results found.  ELIGIBLE FOR AVAILABLE RESEARCH PROTOCOL: no  ASSESSMENT: 41 y.o. [place] woman status post a right breast biopsy on 04/17/2021 for ductal carcinoma in situ, grade 2, with possible microinvasion; estrogen and progesterone receptor positive.  She underwent definitive surgery, right breast lumpectomy and no evidence of residual DCIS on final pathology.  She completed adjuvant radiation on August 17, 2021.  She tolerated it well.  She tried tamoxifen full dose and could not tolerate it well hence we have decreased the dose to 10 mg daily and she is here for follow-up. She is tolerating tamoxifen 10 mg relatively well but continues to have some hot flashes, profuse sweating, irritability and jitteriness.  She is also on some medication which interact with venlafaxine.  Hence have recommended trying oxybutynin at a very low-dose 2.5 mg dispensed to the pharmacy of her choice daily to overcome the vasomotor symptoms.  She understands the side effects of oxybutynin including dry mouth and urinary retention.  We will do a telephone visit in 1 month to see how her vasomotor symptoms are responding to the low-dose of oxybutynin.  She will otherwise schedule her mammogram and return to clinic in 6 months. Total time spent: 30 minutes   *Total Encounter Time as defined by the Centers for Medicare and Medicaid Services includes, in addition to the face-to-face time of a patient visit (documented in the note above) non-face-to-face time: obtaining and reviewing outside history,  ordering and reviewing medications, tests or procedures, care coordination (communications with other health care professionals or caregivers) and documentation in the medical record.

## 2022-03-01 ENCOUNTER — Other Ambulatory Visit: Payer: Self-pay | Admitting: Hematology and Oncology

## 2022-03-25 ENCOUNTER — Inpatient Hospital Stay: Payer: BC Managed Care – PPO | Attending: Hematology and Oncology | Admitting: Hematology and Oncology

## 2022-03-25 ENCOUNTER — Encounter: Payer: Self-pay | Admitting: Hematology and Oncology

## 2022-03-25 DIAGNOSIS — Z79810 Long term (current) use of selective estrogen receptor modulators (SERMs): Secondary | ICD-10-CM | POA: Insufficient documentation

## 2022-03-25 DIAGNOSIS — Z923 Personal history of irradiation: Secondary | ICD-10-CM | POA: Diagnosis not present

## 2022-03-25 DIAGNOSIS — Z170 Estrogen receptor positive status [ER+]: Secondary | ICD-10-CM | POA: Insufficient documentation

## 2022-03-25 DIAGNOSIS — N951 Menopausal and female climacteric states: Secondary | ICD-10-CM

## 2022-03-25 DIAGNOSIS — D0511 Intraductal carcinoma in situ of right breast: Secondary | ICD-10-CM | POA: Diagnosis present

## 2022-03-25 NOTE — Progress Notes (Signed)
[place]  Telephone:(336) [phone] [phone]) [phone]    ID: Kathy Hickman DOB: 01/13/1981  MR#: [id number]  UMP#:[id number]  Patient Care Team: Donnajean Lopes, MD as PCP - General (Internal Medicine) Mauro Kaufmann, RN as Oncology Nurse Navigator Rockwell Germany, RN as Oncology Nurse Navigator Erroll Luna, MD as Consulting Physician (General Surgery) Magrinat, Virgie Dad, MD (Inactive) as Consulting Physician (Oncology) Kyung Rudd, MD as Consulting Physician (Radiation Oncology) Armandina Stammer, DO as Consulting Physician (Obstetrics and Gynecology) Benay Pike, MD  CHIEF COMPLAINT: noninvasive breast cancer, estrogen receptor positive  CURRENT TREATMENT:   HISTORY OF CURRENT ILLNESS:  "Kathy Hickman" had her first ever screening mammography on 03/21/2021 showing a possible abnormality in the right breast. She underwent right diagnostic mammography with tomography and right breast ultrasonography at [facility] on 04/11/2021 showing: breast density category C; 0.8 cm irregular mass and associated distortion in right breast at 10 o'clock; no axillary lymphadenopathy.  Accordingly on 04/17/2021 she proceeded to biopsy of the right breast area in question. 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 Prognostic indicators significant for: estrogen receptor, 85% positive and progesterone receptor, 10% positive, both with strong staining intensity.    Cancer Staging  Ductal carcinoma in situ (DCIS) of right breast Staging form: Breast, AJCC 8th Edition - Clinical stage from 04/25/2021: Stage 0 (cTis (DCIS), cN0, cM0, ER+, PR+, HER2: Not Assessed) - Unsigned Stage prefix: Initial diagnosis Nuclear grade: G2 Laterality: Right Staged by: Pathologist and managing physician Stage used in treatment planning: Yes National guidelines used in treatment  planning: Yes Type of national guideline used in treatment planning: NCCN  Patient underwent right breast seed localized lumpectomy on 05/10/2021 for right breast ductal carcinoma in situ. Surgical pathology showed residual complex sclerosing lesion with florid usual ductal hyperplasia, no residual ductal carcinoma identified.   She completed adjuvant radiation right She is on antiestrogen therapy with tamoxifen.  INTERVAL HISTORY: We schedule a telephone visit today to review intractable hot flashes and see if she tolerated oxybutynin well.  She however did not have a chance to try this.  She tells me that she is having heavy menstruation for the past 1-1/2 weeks with large clots draining through pads and she is feeling very weak because of that.  She was hoping to go see her gynecologist soon.  She also complains of pretty bad hot flashes for now.    COVID 19 VACCINATION STATUS: not vaccinated as of September 2022; has not had COVID   PAST MEDICAL HISTORY: Past Medical History:  Diagnosis Date   Allergy    Anxiety    Arthritis    osteoarthritis   Asthma    Carpal tunnel syndrome    Depression    Fibromyalgia    Hypertension    Pain, chronic    Pneumonia     PAST SURGICAL HISTORY: Past Surgical History:  Procedure Laterality Date   BREAST LUMPECTOMY WITH RADIOACTIVE SEED LOCALIZATION Right 05/10/2021   Procedure: RIGHT BREAST LUMPECTOMY WITH RADIOACTIVE SEED LOCALIZATION;  Surgeon: Erroll Luna, MD;  Location: [facility];  Service: General;  Laterality: Right;   CARPAL TUNNEL RELEASE     CESAREAN SECTION  08/05/2000   Dr. Ruthann Cancer    FAMILY HISTORY: Family History  Problem Relation Age of Onset   Anxiety disorder Mother    Breast cancer Paternal Aunt 3   Colon cancer Paternal 9  65   Pancreatic cancer Maternal Grandmother 57   Lung cancer Paternal Grandfather        dx. 60s, smoked   Asthma Son    Allergies Son    Her father died at age 52 from  alcohol, drug issues. Her mother is currently 25 years old (as of 04/2021). Kathy Hickman has one brother (and no sisters). She reports lung cancer in her paternal grandfather in his 63's, breast cancer in a paternal aunt at age 76, colon cancer in a different paternal aunt at age 80, and pancreatic cancer in her maternal grandmother at age 44.   GYNECOLOGIC HISTORY:  No LMP recorded. Menarche: 41 years old Age at first live birth: 41 years old [place] P 1 LMP 04/07/2021 Contraceptive used "on and off for past 15 years" HRT n/a  Hysterectomy? no BSO? no   SOCIAL HISTORY: (updated 04/2021)  [name] is currently working for [employer], where she has worked for the last 20 years. She is single (divorced). She lives at home with her mother, who cleans houses part-time. Son Kathy Hickman, age 95, works in Stage manager in [place] with his father. Billye is not a Designer, fashion/clothing.    ADVANCED DIRECTIVES: not in place   HEALTH MAINTENANCE: Social History   Tobacco Use   Smoking status: Never   Smokeless tobacco: Never  Vaping Use   Vaping Use: Never used  Substance Use Topics   Alcohol use: No   Drug use: No    Comment: narcotic dependence     Colonoscopy: 04/2009 (Dr. Sharlett Iles)  PAP: 03/2021  Bone density: unsure   Allergies  Allergen Reactions   Amoxicillin Hives, Shortness Of Breath and Swelling   Sulfonamide Derivatives Hives, Shortness Of Breath and Swelling   Latex Rash    Current Outpatient Medications  Medication Sig Dispense Refill   ALPRAZolam (XANAX) 0.5 MG tablet Take 0.5 mg by mouth daily.     Ascorbic Acid (VITAMIN C) 1000 MG tablet Take 1,000 mg by mouth daily.     atenolol (TENORMIN) 50 MG tablet Take 50 mg by mouth daily.     cetirizine-pseudoephedrine (ZYRTEC-D) 5-120 MG per tablet Take 1 tablet by mouth daily. allergies     Cholecalciferol (D3-1000 PO) Take by mouth.     clomiPRAMINE (ANAFRANIL) 25 MG capsule 25 mg.     Cod Liver Oil CAPS Take by mouth.      docusate sodium (COLACE) 100 MG capsule Take 1 capsule by mouth every other day.     ferrous gluconate (FERGON) 240 (27 FE) MG tablet 1 tablet daily.     fluticasone (FLONASE) 50 MCG/ACT nasal spray Place 1 spray into both nostrils daily.     gabapentin (NEURONTIN) 300 MG capsule Take 2 capsules by mouth in the morning and at bedtime.     HYDROmorphone HCl ER 16 MG TB24 Take 1 tablet by mouth daily at 4 PM.     ibuprofen (ADVIL) 800 MG tablet Take 1 tablet (800 mg total) by mouth every 8 (eight) hours as needed. 30 tablet 0   LYSINE PO Take by mouth.     montelukast (SINGULAIR) 10 MG tablet Take 10 mg by mouth daily.     Multiple Vitamins-Minerals (MULTIVITAMINS THER. W/MINERALS) TABS Take 1 tablet by mouth daily.       oxybutynin (DITROPAN) 5 MG tablet Take 0.5 tablets (2.5 mg total) by mouth daily. 30 tablet 2   tamoxifen (NOLVADEX) 20 MG tablet Take 0.5 tablets (10 mg total) by mouth daily. [place]  tablet 5   tiZANidine (ZANAFLEX) 4 MG capsule Take 4 mg by mouth in the morning and at bedtime.     topiramate (TOPAMAX) 25 MG tablet Take 2 tablets by mouth daily.     No current facility-administered medications for this visit.    Left  There were no vitals filed for this visit.    There is no height or weight on file to calculate BMI.   Wt Readings from Last 3 Encounters:  02/28/22 237 lb 11.2 oz (107.8 kg)  08/31/21 238 lb 4.8 oz (108.1 kg)  06/07/21 226 lb (102.5 kg)      ECOG FS:1 - Symptomatic but completely ambulatory  Physical examination not done, televisit   LAB RESULTS:  CMP     Component Value Date/Time   NA 140 04/25/2021 0828   K 4.6 04/25/2021 0828   CL 110 04/25/2021 0828   CO2 21 (L) 04/25/2021 0828   GLUCOSE 94 04/25/2021 0828   BUN 20 04/25/2021 0828   CREATININE 0.95 04/25/2021 0828   CALCIUM 9.1 04/25/2021 0828   PROT 7.2 04/25/2021 0828   ALBUMIN 4.3 04/25/2021 0828   AST 17 04/25/2021 0828   ALT 19 04/25/2021 0828   ALKPHOS 70 04/25/2021 0828    BILITOT 0.3 04/25/2021 0828   GFRNONAA >60 04/25/2021 0828   GFRAA >90 07/15/2011 0436    No results found for: "TOTALPROTELP", "ALBUMINELP", "A1GS", "A2GS", "BETS", "BETA2SER", "GAMS", "MSPIKE", "SPEI"  Lab Results  Component Value Date   WBC 7.4 04/25/2021   NEUTROABS 3.5 04/25/2021   HGB 12.9 04/25/2021   HCT 38.4 04/25/2021   MCV 87.1 04/25/2021   PLT 279 04/25/2021    No results found for: "LABCA2"  No components found for: "MAUQJF354"  No results for input(s): "INR" in the last 168 hours.  No results found for: "LABCA2"  No results found for: "TGY563"  No results found for: "CAN125"  No results found for: "CAN153"  No results found for: "CA2729"  No components found for: "HGQUANT"  No results found for: "CEA1", "CEA" / No results found for: "CEA1", "CEA"   No results found for: "AFPTUMOR"  No results found for: "CHROMOGRNA"  No results found for: "KPAFRELGTCHN", "LAMBDASER", "KAPLAMBRATIO" (kappa/lambda light chains)  No results found for: "HGBA", "HGBA2QUANT", "HGBFQUANT", "HGBSQUAN" (Hemoglobinopathy evaluation)   No results found for: "LDH"  Lab Results  Component Value Date   IRON <10 (L) 07/14/2011   TIBC Not calculated due to Iron <10. 07/14/2011   IRONPCTSAT Not calculated due to Iron <10. 07/14/2011   (Iron and TIBC)  Lab Results  Component Value Date   FERRITIN 116 07/14/2011    Urinalysis    Component Value Date/Time   COLORURINE YELLOW 03/17/2011 [place] 03/17/2011 1517   LABSPEC 1.009 03/17/2011 1517   PHURINE 6.5 03/17/2011 1517   GLUCOSEU NEGATIVE 03/17/2011 1517   HGBUR NEGATIVE 03/17/2011 1517   BILIRUBINUR NEGATIVE 03/17/2011 1517   KETONESUR NEGATIVE 03/17/2011 1517   PROTEINUR NEGATIVE 03/17/2011 1517   UROBILINOGEN 0.2 03/17/2011 1517   NITRITE NEGATIVE 03/17/2011 1517   LEUKOCYTESUR NEGATIVE 03/17/2011 1517    STUDIES: No results found.  ELIGIBLE FOR AVAILABLE RESEARCH PROTOCOL:  no  ASSESSMENT: 41 y.o. [place] woman status post a right breast biopsy on 04/17/2021 for ductal carcinoma in situ, grade 2, with possible microinvasion; estrogen and progesterone receptor positive.  She underwent definitive surgery, right breast lumpectomy and no evidence of residual DCIS on final pathology.  She completed adjuvant radiation  on August 17, 2021.  She tolerated it well.  She couldn't tolerate full dose of tamoxifen 20 mg, so hence switched to 10 mg She today reports heavy menstruation bleeding through pads for the past 1.5 weeks Recommended to hold tamoxifen and give a call to her OB/GYN for further evaluation.  I will call her back in 2 weeks for telephone visit.  She did not have a chance to try the oxybutynin for hot flashes.  Will reevaluate the hot flashes at her next appointment.  She continues to have intractable hot flashes but currently she is feeling very drained because of the heavy menstruation. Total time spent: 10 minutes  I connected with  Caren Griffins on 03/25/22 by a telephone application and verified that I am speaking with the correct person using two identifiers.   I discussed the limitations of evaluation and management by telemedicine. The patient expressed understanding and agreed to proceed.   *Total Encounter Time as defined by the Centers for Medicare and Medicaid Services includes, in addition to the face-to-face time of a patient visit (documented in the note above) non-face-to-face time: obtaining and reviewing outside history, ordering and reviewing medications, tests or procedures, care coordination (communications with other health care professionals or caregivers) and documentation in the medical record.

## 2022-04-15 ENCOUNTER — Inpatient Hospital Stay: Payer: BC Managed Care – PPO | Attending: Hematology and Oncology | Admitting: Hematology and Oncology

## 2022-04-15 ENCOUNTER — Telehealth: Payer: Self-pay | Admitting: Hematology and Oncology

## 2022-04-15 ENCOUNTER — Encounter: Payer: Self-pay | Admitting: Hematology and Oncology

## 2022-04-15 DIAGNOSIS — N951 Menopausal and female climacteric states: Secondary | ICD-10-CM

## 2022-04-15 DIAGNOSIS — D0511 Intraductal carcinoma in situ of right breast: Secondary | ICD-10-CM

## 2022-04-15 NOTE — Telephone Encounter (Signed)
Contacted patient to scheduled appointments. Left message with appointment details and a call back number if patient had any questions or could not accommodate the time we provided.   

## 2022-04-15 NOTE — Progress Notes (Signed)
[place]  Telephone:(336) [phone] Fax:(336) [phone]    ID: SELDA JALBERT DOB: 1981-07-10  MR#: [id number]  LYY#:[id number]  Patient Care Team: Donnajean Lopes, MD as PCP - General (Internal Medicine) Mauro Kaufmann, RN as Oncology Nurse Navigator Rockwell Germany, RN as Oncology Nurse Navigator Erroll Luna, MD as Consulting Physician (General Surgery) Magrinat, Virgie Dad, MD (Inactive) as Consulting Physician (Oncology) Kyung Rudd, MD as Consulting Physician (Radiation Oncology) Armandina Stammer, DO as Consulting Physician (Obstetrics and Gynecology) Benay Pike, MD  CHIEF COMPLAINT: noninvasive breast cancer, estrogen receptor positive  CURRENT TREATMENT:   HISTORY OF CURRENT ILLNESS:  "Chyane" had her first ever screening mammography on 03/21/2021 showing a possible abnormality in the right breast. She underwent right diagnostic mammography with tomography and right breast ultrasonography at [facility] on 04/11/2021 showing: breast density category C; 0.8 cm irregular mass and associated distortion in right breast at 10 o'clock; no axillary lymphadenopathy.  Accordingly on 04/17/2021 she proceeded to biopsy of the right breast area in question. 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 Prognostic indicators significant for: estrogen receptor, 85% positive and progesterone receptor, 10% positive, both with strong staining intensity.    Cancer Staging  Ductal carcinoma in situ (DCIS) of right breast Staging form: Breast, AJCC 8th Edition - Clinical stage from 04/25/2021: Stage 0 (cTis (DCIS), cN0, cM0, ER+, PR+, HER2: Not Assessed) - Unsigned Stage prefix: Initial diagnosis Nuclear grade: G2 Laterality: Right Staged by: Pathologist and managing physician Stage used in treatment planning: Yes National guidelines used in treatment  planning: Yes Type of national guideline used in treatment planning: NCCN  Patient underwent right breast seed localized lumpectomy on 05/10/2021 for right breast ductal carcinoma in situ. Surgical pathology showed residual complex sclerosing lesion with florid usual ductal hyperplasia, no residual ductal carcinoma identified.   She completed adjuvant radiation right She is on antiestrogen therapy with tamoxifen.  INTERVAL HISTORY: We schedule a telephone visit today to review intractable hot flashes and heavy menstrual bleeding She didn't answer.   COVID 19 VACCINATION STATUS: not vaccinated as of September 2022; has not had COVID   PAST MEDICAL HISTORY: Past Medical History:  Diagnosis Date   Allergy    Anxiety    Arthritis    osteoarthritis   Asthma    Carpal tunnel syndrome    Depression    Fibromyalgia    Hypertension    Pain, chronic    Pneumonia     PAST SURGICAL HISTORY: Past Surgical History:  Procedure Laterality Date   BREAST LUMPECTOMY WITH RADIOACTIVE SEED LOCALIZATION Right 05/10/2021   Procedure: RIGHT BREAST LUMPECTOMY WITH RADIOACTIVE SEED LOCALIZATION;  Surgeon: Erroll Luna, MD;  Location: [place];  Service: General;  Laterality: Right;   [place]  08/05/2000   Dr. Ruthann Cancer    FAMILY HISTORY: Family History  Problem Relation Age of Onset   Anxiety disorder Mother    Breast cancer Paternal Aunt 64   Colon cancer Paternal Aunt 58   Pancreatic cancer Maternal Grandmother 62   Lung cancer Paternal Grandfather        dx. 50s, smoked   Asthma Son    Allergies Son    Her father died at age 42 from alcohol, drug issues. Her mother is currently 47 years old (as of 04/2021). Sable has one brother (and no sisters).  She reports lung cancer in her paternal grandfather in his 15's, breast cancer in a paternal aunt at age 38, colon cancer in a different paternal aunt at age 66, and pancreatic cancer in her  maternal grandmother at age 37.   GYNECOLOGIC HISTORY:  No LMP recorded. Menarche: 41 years old Age at first live birth: 41 years old [place] P 1 LMP 04/07/2021 Contraceptive used "on and off for past 15 years" HRT n/a  Hysterectomy? no BSO? no   SOCIAL HISTORY: (updated 04/2021)  Sedonia is currently working for [employer], where she has worked for the last 20 years. She is single (divorced). She lives at home with her mother, who cleans houses part-time. Son [name], age 85, works in Stage manager in [place] with his father. Nanda is not a Designer, fashion/clothing.    ADVANCED DIRECTIVES: not in place   HEALTH MAINTENANCE: Social History   Tobacco Use   Smoking status: Never   Smokeless tobacco: Never  Vaping Use   Vaping Use: Never used  Substance Use Topics   Alcohol use: No   Drug use: No    Comment: narcotic dependence     Colonoscopy: 04/2009 (Dr. Sharlett Iles)  PAP: 03/2021  Bone density: unsure   Allergies  Allergen Reactions   Amoxicillin Hives, Shortness Of Breath and Swelling   Sulfonamide Derivatives Hives, Shortness Of Breath and Swelling   Latex Rash    Current Outpatient Medications  Medication Sig Dispense Refill   ALPRAZolam (XANAX) 0.5 MG tablet Take 0.5 mg by mouth daily.     Ascorbic Acid (VITAMIN C) 1000 MG tablet Take 1,000 mg by mouth daily.     atenolol (TENORMIN) 50 MG tablet Take 50 mg by mouth daily.     cetirizine-pseudoephedrine (ZYRTEC-D) 5-120 MG per tablet Take 1 tablet by mouth daily. allergies     Cholecalciferol (D3-1000 PO) Take by mouth.     clomiPRAMINE (ANAFRANIL) 25 MG capsule 25 mg.     Cod Liver Oil CAPS Take by mouth.     docusate sodium (COLACE) 100 MG capsule Take 1 capsule by mouth every other day.     ferrous gluconate (FERGON) 240 (27 FE) MG tablet 1 tablet daily.     fluticasone (FLONASE) 50 MCG/ACT nasal spray Place 1 spray into both nostrils daily.     gabapentin (NEURONTIN) 300 MG capsule Take 2 capsules by mouth  in the morning and at bedtime.     HYDROmorphone HCl ER 16 MG TB24 Take 1 tablet by mouth daily at 4 PM.     ibuprofen (ADVIL) 800 MG tablet Take 1 tablet (800 mg total) by mouth every 8 (eight) hours as needed. 30 tablet 0   LYSINE PO Take by mouth.     montelukast (SINGULAIR) 10 MG tablet Take 10 mg by mouth daily.     Multiple Vitamins-Minerals (MULTIVITAMINS THER. W/MINERALS) TABS Take 1 tablet by mouth daily.       oxybutynin (DITROPAN) 5 MG tablet Take 0.5 tablets (2.5 mg total) by mouth daily. 30 tablet 2   tamoxifen (NOLVADEX) 20 MG tablet Take 0.5 tablets (10 mg total) by mouth daily. 30 tablet 5   tiZANidine (ZANAFLEX) 4 MG capsule Take 4 mg by mouth in the morning and at bedtime.     topiramate (TOPAMAX) 25 MG tablet Take 2 tablets by mouth daily.     No current facility-administered medications for this visit.    Left  There were no vitals filed for this visit.    There  is no height or weight on file to calculate BMI.   Wt Readings from Last 3 Encounters:  02/28/22 237 lb 11.2 oz (107.8 kg)  08/31/21 238 lb 4.8 oz (108.1 kg)  06/07/21 226 lb (102.5 kg)      ECOG FS:1 - Symptomatic but completely ambulatory  Physical examination not done, televisit   LAB RESULTS:  CMP     Component Value Date/Time   NA 140 04/25/2021 0828   K 4.6 04/25/2021 0828   CL 110 04/25/2021 0828   CO2 21 (L) 04/25/2021 0828   GLUCOSE 94 04/25/2021 0828   BUN 20 04/25/2021 0828   CREATININE 0.95 04/25/2021 0828   CALCIUM 9.1 04/25/2021 0828   PROT 7.2 04/25/2021 0828   ALBUMIN 4.3 04/25/2021 0828   AST 17 04/25/2021 0828   ALT 19 04/25/2021 0828   ALKPHOS 70 04/25/2021 0828   BILITOT 0.3 04/25/2021 0828   GFRNONAA >60 04/25/2021 0828   GFRAA >90 07/15/2011 0436    No results found for: "TOTALPROTELP", "ALBUMINELP", "A1GS", "A2GS", "BETS", "BETA2SER", "GAMS", "MSPIKE", "SPEI"  Lab Results  Component Value Date   WBC 7.4 04/25/2021   NEUTROABS 3.5 04/25/2021   HGB 12.9  04/25/2021   HCT 38.4 04/25/2021   MCV 87.1 04/25/2021   PLT 279 04/25/2021    No results found for: "LABCA2"  No components found for: "MPNTIR443"  No results for input(s): "INR" in the last 168 hours.  No results found for: "LABCA2"  No results found for: "XVQ008"  No results found for: "CAN125"  No results found for: "CAN153"  No results found for: "CA2729"  No components found for: "HGQUANT"  No results found for: "CEA1", "CEA" / No results found for: "CEA1", "CEA"   No results found for: "AFPTUMOR"  No results found for: "CHROMOGRNA"  No results found for: "KPAFRELGTCHN", "LAMBDASER", "KAPLAMBRATIO" (kappa/lambda light chains)  No results found for: "HGBA", "HGBA2QUANT", "HGBFQUANT", "HGBSQUAN" (Hemoglobinopathy evaluation)   No results found for: "LDH"  Lab Results  Component Value Date   IRON <10 (L) 07/14/2011   TIBC Not calculated due to Iron <10. 07/14/2011   IRONPCTSAT Not calculated due to Iron <10. 07/14/2011   (Iron and TIBC)  Lab Results  Component Value Date   FERRITIN 116 07/14/2011    Urinalysis    Component Value Date/Time   COLORURINE YELLOW 03/17/2011 [place] 03/17/2011 1517   LABSPEC 1.009 03/17/2011 1517   PHURINE 6.5 03/17/2011 1517   GLUCOSEU NEGATIVE 03/17/2011 1517   HGBUR NEGATIVE 03/17/2011 1517   BILIRUBINUR NEGATIVE 03/17/2011 1517   KETONESUR NEGATIVE 03/17/2011 1517   PROTEINUR NEGATIVE 03/17/2011 1517   UROBILINOGEN 0.2 03/17/2011 1517   NITRITE NEGATIVE 03/17/2011 1517   LEUKOCYTESUR NEGATIVE 03/17/2011 1517    STUDIES: No results found.  ELIGIBLE FOR AVAILABLE RESEARCH PROTOCOL: no  ASSESSMENT: 41 y.o. [place] woman status post a right breast biopsy on 04/17/2021 for ductal carcinoma in situ, grade 2, with possible microinvasion; estrogen and progesterone receptor positive.  She underwent definitive surgery, right breast lumpectomy and no evidence of residual DCIS on final pathology.   She completed adjuvant radiation on August 17, 2021.  She tolerated it well.  She couldn't tolerate full dose of tamoxifen 20 mg, so hence switched to 10 mg She on her last visit has reported heavy menstruation bleeding through pads for the past 1.5 weeks We recommended to hold tamoxifen and follow up with gyn. She also was prescribed some oxybutnin for hot flashes, didn't start  this, so plan was to FU She didn't answer her phone call however. I called her twice, left a voice mail to call us back.  Total time spent: 10 minutes  I connected with  Caren Griffins on 04/15/22 by a telephone application and verified that I am speaking with the correct person using two identifiers.   I discussed the limitations of evaluation and management by telemedicine. The patient expressed understanding and agreed to proceed.   *Total Encounter Time as defined by the Centers for Medicare and Medicaid Services includes, in addition to the face-to-face time of a patient visit (documented in the note above) non-face-to-face time: obtaining and reviewing outside history, ordering and reviewing medications, tests or procedures, care coordination (communications with other health care professionals or caregivers) and documentation in the medical record.

## 2022-04-25 ENCOUNTER — Inpatient Hospital Stay: Payer: BC Managed Care – PPO | Admitting: Hematology and Oncology

## 2022-04-25 NOTE — Progress Notes (Deleted)
[place]  Telephone:(336) [phone] Fax:(336) [phone]    ID: Caren Griffins DOB: 1980-09-06  MR#: [id number]  ZTI#:[id number]  Patient Care Team: Donnajean Lopes, MD as PCP - General (Internal Medicine) Erroll Luna, MD as Consulting Physician (General Surgery) Kyung Rudd, MD as Consulting Physician (Radiation Oncology) Armandina Stammer, DO as Consulting Physician (Obstetrics and Gynecology) Benay Pike, MD as Consulting Physician (Hematology and Oncology) Benay Pike, MD  CHIEF COMPLAINT: noninvasive breast cancer, estrogen receptor positive  CURRENT TREATMENT:   HISTORY OF CURRENT ILLNESS:  "Ryder" had her first ever screening mammography on 03/21/2021 showing a possible abnormality in the right breast. She underwent right diagnostic mammography with tomography and right breast ultrasonography at [facility] on 04/11/2021 showing: breast density category C; 0.8 cm irregular mass and associated distortion in right breast at 10 o'clock; no axillary lymphadenopathy.  Accordingly on 04/17/2021 she proceeded to biopsy of the right breast area in question. 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 Prognostic indicators significant for: estrogen receptor, 85% positive and progesterone receptor, 10% positive, both with strong staining intensity.    Cancer Staging  Ductal carcinoma in situ (DCIS) of right breast Staging form: Breast, AJCC 8th Edition - Clinical stage from 04/25/2021: Stage 0 (cTis (DCIS), cN0, cM0, ER+, PR+, HER2: Not Assessed) - Unsigned Stage prefix: Initial diagnosis Nuclear grade: G2 Laterality: Right Staged by: Pathologist and managing physician Stage used in treatment planning: Yes National guidelines used in treatment planning: Yes Type of national guideline used in treatment planning: NCCN  Patient underwent  right breast seed localized lumpectomy on 05/10/2021 for right breast ductal carcinoma in situ. Surgical pathology showed residual complex sclerosing lesion with florid usual ductal hyperplasia, no residual ductal carcinoma identified.   She completed adjuvant radiation right She is on antiestrogen therapy with tamoxifen.  INTERVAL HISTORY: We schedule a telephone visit today to review intractable hot flashes and heavy menstrual bleeding She is here for follow up on tamoxifen 10 mg daily.   COVID 19 VACCINATION STATUS: not vaccinated as of September 2022; has not had COVID   PAST MEDICAL HISTORY: Past Medical History:  Diagnosis Date   Allergy    Anxiety    Arthritis    osteoarthritis   Asthma    Carpal tunnel syndrome    Depression    Fibromyalgia    Hypertension    Pain, chronic    Pneumonia     PAST SURGICAL HISTORY: Past Surgical History:  Procedure Laterality Date   BREAST LUMPECTOMY WITH RADIOACTIVE SEED LOCALIZATION Right 05/10/2021   Procedure: RIGHT BREAST LUMPECTOMY WITH RADIOACTIVE SEED LOCALIZATION;  Surgeon: Erroll Luna, MD;  Location: [place];  Service: General;  Laterality: Right;   [place]  08/05/2000   Dr. Ruthann Cancer    FAMILY HISTORY: Family History  Problem Relation Age of Onset   Anxiety disorder Mother    Breast cancer Paternal Aunt 79   Colon cancer Paternal Aunt 19   Pancreatic cancer Maternal Grandmother 65   Lung cancer Paternal Grandfather        dx. 74s, smoked   Asthma Son    Allergies Son    Her father died at age 39 from alcohol, drug issues. Her mother is currently 72 years old (as of 04/2021). Jasminne has one brother (and no sisters). She reports lung cancer in her paternal grandfather  in his 65's, breast cancer in a paternal aunt at age 77, colon cancer in a different paternal aunt at age 13, and pancreatic cancer in her maternal grandmother at age 48.   GYNECOLOGIC HISTORY:  No  LMP recorded. Menarche: 41 years old Age at first live birth: 41 years old [place] P 1 LMP 04/07/2021 Contraceptive used "on and off for past 15 years" HRT n/a  Hysterectomy? no BSO? no   SOCIAL HISTORY: (updated 04/2021)  Alga is currently working for [employer], where she has worked for the last 20 years. She is single (divorced). She lives at home with her mother, who cleans houses part-time. Son [name], age 104, works in Stage manager in [place] with his father. Anvitha is not a Designer, fashion/clothing.    ADVANCED DIRECTIVES: not in place   HEALTH MAINTENANCE: Social History   Tobacco Use   Smoking status: Never   Smokeless tobacco: Never  Vaping Use   Vaping Use: Never used  Substance Use Topics   Alcohol use: No   Drug use: No    Comment: narcotic dependence     Colonoscopy: 04/2009 (Dr. Sharlett Iles)  PAP: 03/2021  Bone density: unsure   Allergies  Allergen Reactions   Amoxicillin Hives, Shortness Of Breath and Swelling   Sulfonamide Derivatives Hives, Shortness Of Breath and Swelling   Latex Rash    Current Outpatient Medications  Medication Sig Dispense Refill   ALPRAZolam (XANAX) 0.5 MG tablet Take 0.5 mg by mouth daily.     Ascorbic Acid (VITAMIN C) 1000 MG tablet Take 1,000 mg by mouth daily.     atenolol (TENORMIN) 50 MG tablet Take 50 mg by mouth daily.     cetirizine-pseudoephedrine (ZYRTEC-D) 5-120 MG per tablet Take 1 tablet by mouth daily. allergies     Cholecalciferol (D3-1000 PO) Take by mouth.     clomiPRAMINE (ANAFRANIL) 25 MG capsule 25 mg.     Cod Liver Oil CAPS Take by mouth.     docusate sodium (COLACE) 100 MG capsule Take 1 capsule by mouth every other day.     ferrous gluconate (FERGON) 240 (27 FE) MG tablet 1 tablet daily.     fluticasone (FLONASE) 50 MCG/ACT nasal spray Place 1 spray into both nostrils daily.     gabapentin (NEURONTIN) 300 MG capsule Take 2 capsules by mouth in the morning and at bedtime.     HYDROmorphone HCl ER 16 MG  TB24 Take 1 tablet by mouth daily at 4 PM.     ibuprofen (ADVIL) 800 MG tablet Take 1 tablet (800 mg total) by mouth every 8 (eight) hours as needed. 30 tablet 0   LYSINE PO Take by mouth.     montelukast (SINGULAIR) 10 MG tablet Take 10 mg by mouth daily.     Multiple Vitamins-Minerals (MULTIVITAMINS THER. W/MINERALS) TABS Take 1 tablet by mouth daily.       oxybutynin (DITROPAN) 5 MG tablet Take 0.5 tablets (2.5 mg total) by mouth daily. 30 tablet 2   tamoxifen (NOLVADEX) 20 MG tablet Take 0.5 tablets (10 mg total) by mouth daily. 30 tablet 5   tiZANidine (ZANAFLEX) 4 MG capsule Take 4 mg by mouth in the morning and at bedtime.     topiramate (TOPAMAX) 25 MG tablet Take 2 tablets by mouth daily.     No current facility-administered medications for this visit.    Left  There were no vitals filed for this visit.    There is no height or weight on file to  calculate BMI.   Wt Readings from Last 3 Encounters:  02/28/22 237 lb 11.2 oz (107.8 kg)  08/31/21 238 lb 4.8 oz (108.1 kg)  06/07/21 226 lb (102.5 kg)      ECOG FS:1 - Symptomatic but completely ambulatory  Physical examination not done, televisit   LAB RESULTS:  CMP     Component Value Date/Time   NA 140 04/25/2021 0828   K 4.6 04/25/2021 0828   CL 110 04/25/2021 0828   CO2 21 (L) 04/25/2021 0828   GLUCOSE 94 04/25/2021 0828   BUN 20 04/25/2021 0828   CREATININE 0.95 04/25/2021 0828   CALCIUM 9.1 04/25/2021 0828   PROT 7.2 04/25/2021 0828   ALBUMIN 4.3 04/25/2021 0828   AST 17 04/25/2021 0828   ALT 19 04/25/2021 0828   ALKPHOS 70 04/25/2021 0828   BILITOT 0.3 04/25/2021 0828   GFRNONAA >60 04/25/2021 0828   GFRAA >90 07/15/2011 0436    No results found for: "TOTALPROTELP", "ALBUMINELP", "A1GS", "A2GS", "BETS", "BETA2SER", "GAMS", "MSPIKE", "SPEI"  Lab Results  Component Value Date   WBC 7.4 04/25/2021   NEUTROABS 3.5 04/25/2021   HGB 12.9 04/25/2021   HCT 38.4 04/25/2021   MCV 87.1 04/25/2021   PLT 279  04/25/2021    No results found for: "LABCA2"  No components found for: "VZCHYI502"  No results for input(s): "INR" in the last 168 hours.  No results found for: "LABCA2"  No results found for: "DXA128"  No results found for: "CAN125"  No results found for: "CAN153"  No results found for: "CA2729"  No components found for: "HGQUANT"  No results found for: "CEA1", "CEA" / No results found for: "CEA1", "CEA"   No results found for: "AFPTUMOR"  No results found for: "CHROMOGRNA"  No results found for: "KPAFRELGTCHN", "LAMBDASER", "KAPLAMBRATIO" (kappa/lambda light chains)  No results found for: "HGBA", "HGBA2QUANT", "HGBFQUANT", "HGBSQUAN" (Hemoglobinopathy evaluation)   No results found for: "LDH"  Lab Results  Component Value Date   IRON <10 (L) 07/14/2011   TIBC Not calculated due to Iron <10. 07/14/2011   IRONPCTSAT Not calculated due to Iron <10. 07/14/2011   (Iron and TIBC)  Lab Results  Component Value Date   FERRITIN 116 07/14/2011    Urinalysis    Component Value Date/Time   COLORURINE YELLOW 03/17/2011 [place] 03/17/2011 1517   LABSPEC 1.009 03/17/2011 1517   PHURINE 6.5 03/17/2011 1517   GLUCOSEU NEGATIVE 03/17/2011 1517   HGBUR NEGATIVE 03/17/2011 1517   BILIRUBINUR NEGATIVE 03/17/2011 1517   KETONESUR NEGATIVE 03/17/2011 1517   PROTEINUR NEGATIVE 03/17/2011 1517   UROBILINOGEN 0.2 03/17/2011 1517   NITRITE NEGATIVE 03/17/2011 1517   LEUKOCYTESUR NEGATIVE 03/17/2011 1517    STUDIES: No results found.  ELIGIBLE FOR AVAILABLE RESEARCH PROTOCOL: no  ASSESSMENT: 41 y.o. [place] woman status post a right breast biopsy on 04/17/2021 for ductal carcinoma in situ, grade 2, with possible microinvasion; estrogen and progesterone receptor positive.  She underwent definitive surgery, right breast lumpectomy and no evidence of residual DCIS on final pathology.  She completed adjuvant radiation on August 17, 2021.  She tolerated  it well.  She couldn't tolerate full dose of tamoxifen 20 mg, so hence switched to 10 mg She on her last visit has reported heavy menstruation bleeding through pads for the past 1.5 weeks We recommended to hold tamoxifen and follow up with gyn. She also was prescribed some oxybutnin for hot flashes, didn't start this, so plan was to FU She didn't  answer her phone call however. I called her twice, left a voice mail to call us back.  Total time spent: 10 minutes  I connected with  Caren Griffins on 04/25/22 by a telephone application and verified that I am speaking with the correct person using two identifiers.   I discussed the limitations of evaluation and management by telemedicine. The patient expressed understanding and agreed to proceed.   *Total Encounter Time as defined by the Centers for Medicare and Medicaid Services includes, in addition to the face-to-face time of a patient visit (documented in the note above) non-face-to-face time: obtaining and reviewing outside history, ordering and reviewing medications, tests or procedures, care coordination (communications with other health care professionals or caregivers) and documentation in the medical record.

## 2022-05-07 ENCOUNTER — Encounter: Payer: Self-pay | Admitting: *Deleted

## 2022-08-30 ENCOUNTER — Inpatient Hospital Stay: Payer: BC Managed Care – PPO | Attending: Hematology and Oncology | Admitting: Hematology and Oncology

## 2022-08-30 ENCOUNTER — Other Ambulatory Visit: Payer: Self-pay

## 2022-08-30 ENCOUNTER — Encounter: Payer: Self-pay | Admitting: Hematology and Oncology

## 2022-08-30 VITALS — BP 127/72 | HR 80 | Temp 97.8°F | Resp 16 | Ht 64.0 in | Wt 244.3 lb

## 2022-08-30 DIAGNOSIS — D0511 Intraductal carcinoma in situ of right breast: Secondary | ICD-10-CM

## 2022-08-30 DIAGNOSIS — Z79899 Other long term (current) drug therapy: Secondary | ICD-10-CM | POA: Insufficient documentation

## 2022-08-30 DIAGNOSIS — Z923 Personal history of irradiation: Secondary | ICD-10-CM | POA: Insufficient documentation

## 2022-08-30 DIAGNOSIS — N951 Menopausal and female climacteric states: Secondary | ICD-10-CM | POA: Diagnosis not present

## 2022-08-30 DIAGNOSIS — Z170 Estrogen receptor positive status [ER+]: Secondary | ICD-10-CM | POA: Insufficient documentation

## 2022-08-30 NOTE — Progress Notes (Signed)
[place]  Telephone:(336) [phone] Fax:(336) [phone]    ID: Kathy Hickman DOB: 1980-10-18  MR#: [id number]  HUT#:[id number]  Patient Care Team: Donnajean Lopes, MD as PCP - General (Internal Medicine) Erroll Luna, MD as Consulting Physician (General Surgery) Kyung Rudd, MD as Consulting Physician (Radiation Oncology) Armandina Stammer, DO as Consulting Physician (Obstetrics and Gynecology) Benay Pike, MD as Consulting Physician (Hematology and Oncology) Benay Pike, MD  CHIEF COMPLAINT: noninvasive breast cancer, estrogen receptor positive  CURRENT TREATMENT:   HISTORY OF CURRENT ILLNESS:  "Kathy Hickman" had her first ever screening mammography on 03/21/2021 showing a possible abnormality in the right breast. She underwent right diagnostic mammography with tomography and right breast ultrasonography at [facility] on 04/11/2021 showing: breast density category C; 0.8 cm irregular mass and associated distortion in right breast at 10 o'clock; no axillary lymphadenopathy.  Accordingly on 04/17/2021 she proceeded to biopsy of the right breast area in question. 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 Prognostic indicators significant for: estrogen receptor, 85% positive and progesterone receptor, 10% positive, both with strong staining intensity.    Cancer Staging  Ductal carcinoma in situ (DCIS) of right breast Staging form: Breast, AJCC 8th Edition - Clinical stage from 04/25/2021: Stage 0 (cTis (DCIS), cN0, cM0, ER+, PR+, HER2: Not Assessed) - Unsigned Stage prefix: Initial diagnosis Nuclear grade: G2 Laterality: Right Staged by: Pathologist and managing physician Stage used in treatment planning: Yes National guidelines used in treatment planning: Yes Type of national guideline used in treatment planning: NCCN  Patient underwent  right breast seed localized lumpectomy on 05/10/2021 for right breast ductal carcinoma in situ. Surgical pathology showed residual complex sclerosing lesion with florid usual ductal hyperplasia, no residual ductal carcinoma identified.   She completed adjuvant radiation right She is on antiestrogen therapy with tamoxifen.  INTERVAL HISTORY:  She is here for a follow-up with her husband.  She apparently forgot to stop tamoxifen and to call her OB/GYN and hence has been taking 10 mg.  She continues to report irregular menstruation which is extremely heavy.  She denies any other breast changes.  She had her last mammogram in [place].   COVID 19 VACCINATION STATUS: not vaccinated as of September 2022; has not had COVID   PAST MEDICAL HISTORY: Past Medical History:  Diagnosis Date   Allergy    Anxiety    Arthritis    osteoarthritis   Asthma    Carpal tunnel syndrome    Depression    Fibromyalgia    Hypertension    Pain, chronic    Pneumonia     PAST SURGICAL HISTORY: Past Surgical History:  Procedure Laterality Date   BREAST LUMPECTOMY WITH RADIOACTIVE SEED LOCALIZATION Right 05/10/2021   Procedure: RIGHT BREAST LUMPECTOMY WITH RADIOACTIVE SEED LOCALIZATION;  Surgeon: Erroll Luna, MD;  Location: [place];  Service: General;  Laterality: Right;   [place]  08/05/1980   Dr. Ruthann Cancer    FAMILY HISTORY: Family History  Problem Relation Age of Onset   Anxiety disorder Mother    Breast cancer Paternal Aunt 31   Colon cancer Paternal Aunt 53   Pancreatic cancer Maternal Grandmother 91   Lung cancer Paternal Grandfather        dx. 32s, smoked   Asthma Son    Allergies Son    Her father died at age 66  from alcohol, drug issues. Her mother is currently 52 years old (as of 04/2021). Kathy Hickman has one brother (and no sisters). She reports lung cancer in her paternal grandfather in his 62's, breast cancer in a paternal aunt at age 59,  colon cancer in a different paternal aunt at age 19, and pancreatic cancer in her maternal grandmother at age 93.   GYNECOLOGIC HISTORY:  No LMP recorded. Menarche: 42 years old Age at first live birth: 42 years old [place] P 1 LMP 04/07/2021 Contraceptive used "on and off for past 15 years" HRT n/a  Hysterectomy? no BSO? no   SOCIAL HISTORY: (updated 04/2021)  [name] is currently working for [employer], where she has worked for the last 20 years. She is single (divorced). She lives at home with her mother, who cleans houses part-time. Son [name], age 42, works in Stage manager in [place] with his father. Avanni is not a Designer, fashion/clothing.    ADVANCED DIRECTIVES: not in place   HEALTH MAINTENANCE: Social History   Tobacco Use   Smoking status: Never   Smokeless tobacco: Never  Vaping Use   Vaping Use: Never used  Substance Use Topics   Alcohol use: No   Drug use: No    Comment: narcotic dependence     Colonoscopy: 04/2009 (Dr. Sharlett Iles)  PAP: 03/2021  Bone density: unsure   Allergies  Allergen Reactions   Amoxicillin Hives, Shortness Of Breath and Swelling   Sulfonamide Derivatives Hives, Shortness Of Breath and Swelling   Latex Rash    Current Outpatient Medications  Medication Sig Dispense Refill   ALPRAZolam (XANAX) 0.5 MG tablet Take 0.5 mg by mouth daily.     Ascorbic Acid (VITAMIN C) 1000 MG tablet Take 1,000 mg by mouth daily.     atenolol (TENORMIN) 50 MG tablet Take 50 mg by mouth daily.     cetirizine-pseudoephedrine (ZYRTEC-D) 5-120 MG per tablet Take 1 tablet by mouth daily. allergies     Cholecalciferol (D3-1000 PO) Take by mouth.     clomiPRAMINE (ANAFRANIL) 25 MG capsule 25 mg.     Cod Liver Oil CAPS Take by mouth.     docusate sodium (COLACE) 100 MG capsule Take 1 capsule by mouth every other day.     ferrous gluconate (FERGON) 240 (27 FE) MG tablet 1 tablet daily.     fluticasone (FLONASE) 50 MCG/ACT nasal spray Place 1 spray into both  nostrils daily.     gabapentin (NEURONTIN) 300 MG capsule Take 2 capsules by mouth in the morning and at bedtime.     HYDROmorphone HCl ER 16 MG TB24 Take 1 tablet by mouth daily at 4 PM.     ibuprofen (ADVIL) 800 MG tablet Take 1 tablet (800 mg total) by mouth every 8 (eight) hours as needed. 30 tablet 0   LYSINE PO Take by mouth.     montelukast (SINGULAIR) 10 MG tablet Take 10 mg by mouth daily.     Multiple Vitamins-Minerals (MULTIVITAMINS THER. W/MINERALS) TABS Take 1 tablet by mouth daily.       oxybutynin (DITROPAN) 5 MG tablet Take 0.5 tablets (2.5 mg total) by mouth daily. 30 tablet 2   tamoxifen (NOLVADEX) 20 MG tablet Take 0.5 tablets (10 mg total) by mouth daily. 30 tablet 5   tiZANidine (ZANAFLEX) 4 MG capsule Take 4 mg by mouth in the morning and at bedtime.     topiramate (TOPAMAX) 25 MG tablet Take 2 tablets by mouth daily.     No current facility-administered  medications for this visit.    Left  Vitals:   08/30/22 0945  BP: 127/72  Pulse: 80  Resp: 16  Temp: 97.8 F (36.6 C)  SpO2: 98%      Body mass index is 41.93 kg/m.   Wt Readings from Last 3 Encounters:  08/30/22 244 lb 4.8 oz (110.8 kg)  02/28/22 237 lb 11.2 oz (107.8 kg)  08/31/21 238 lb 4.8 oz (108.1 kg)      ECOG FS:1 - Symptomatic but completely ambulatory  Physical Exam Constitutional:      Appearance: Normal appearance.  Chest:     Comments: Bilateral breasts inspected.  Postsurgical scarring noted in the right breast upper outer quadrant.  No other palpable masses.  Post radiation changes noted in the right breast.  Left breast normal to inspection and palpation.  No regional adenopathy no lower extremity edema Musculoskeletal:     Cervical back: No rigidity.  Lymphadenopathy:     Cervical: No cervical adenopathy.  Neurological:     Mental Status: She is alert.    LAB RESULTS:  CMP     Component Value Date/Time   NA 140 04/25/2021 0828   K 4.6 04/25/2021 0828   CL 110 04/25/2021  0828   CO2 21 (L) 04/25/2021 0828   GLUCOSE 94 04/25/2021 0828   BUN 20 04/25/2021 0828   CREATININE 0.95 04/25/2021 0828   CALCIUM 9.1 04/25/2021 0828   PROT 7.2 04/25/2021 0828   ALBUMIN 4.3 04/25/2021 0828   AST 17 04/25/2021 0828   ALT 19 04/25/2021 0828   ALKPHOS 70 04/25/2021 0828   BILITOT 0.3 04/25/2021 0828   GFRNONAA >60 04/25/2021 0828   GFRAA >90 07/15/2011 0436    No results found for: "TOTALPROTELP", "ALBUMINELP", "A1GS", "A2GS", "BETS", "BETA2SER", "GAMS", "MSPIKE", "SPEI"  Lab Results  Component Value Date   WBC 7.4 04/25/2021   NEUTROABS 3.5 04/25/2021   HGB 12.9 04/25/2021   HCT 38.4 04/25/2021   MCV 87.1 04/25/2021   PLT 279 04/25/2021    No results found for: "LABCA2"  No components found for: "YPPJKD326"  No results for input(s): "INR" in the last 168 hours.  No results found for: "LABCA2"  No results found for: "ZTI458"  No results found for: "CAN125"  No results found for: "CAN153"  No results found for: "CA2729"  No components found for: "HGQUANT"  No results found for: "CEA1", "CEA" / No results found for: "CEA1", "CEA"   No results found for: "AFPTUMOR"  No results found for: "CHROMOGRNA"  No results found for: "KPAFRELGTCHN", "LAMBDASER", "KAPLAMBRATIO" (kappa/lambda light chains)  No results found for: "HGBA", "HGBA2QUANT", "HGBFQUANT", "HGBSQUAN" (Hemoglobinopathy evaluation)   No results found for: "LDH"  Lab Results  Component Value Date   IRON <10 (L) 07/14/2011   TIBC Not calculated due to Iron <10. 07/14/2011   IRONPCTSAT Not calculated due to Iron <10. 07/14/2011   (Iron and TIBC)  Lab Results  Component Value Date   FERRITIN 116 07/14/2011    Urinalysis    Component Value Date/Time   COLORURINE YELLOW 03/17/2011 [place] 03/17/2011 1517   LABSPEC 1.009 03/17/2011 1517   PHURINE 6.5 03/17/2011 1517   GLUCOSEU NEGATIVE 03/17/2011 1517   HGBUR NEGATIVE 03/17/2011 1517   BILIRUBINUR  NEGATIVE 03/17/2011 1517   KETONESUR NEGATIVE 03/17/2011 1517   PROTEINUR NEGATIVE 03/17/2011 1517   UROBILINOGEN 0.2 03/17/2011 1517   NITRITE NEGATIVE 03/17/2011 1517   LEUKOCYTESUR NEGATIVE 03/17/2011 1517    STUDIES: No  results found.  ELIGIBLE FOR AVAILABLE RESEARCH PROTOCOL: no  ASSESSMENT: 42 y.o. [place] woman status post a right breast biopsy on 04/17/2021 for ductal carcinoma in situ, grade 2, with possible microinvasion; estrogen and progesterone receptor positive.  She underwent definitive surgery, right breast lumpectomy and no evidence of residual DCIS on final pathology.  She completed adjuvant radiation on August 17, 2021.  She tolerated it well.  She couldn't tolerate full dose of tamoxifen 20 mg, so hence switched to 10 mg During her last visit, we recommended Ob follow up for heavy menstruation and to hold Tamoxifen. She apparently forgot continue to take tamoxifen 10 mg and did not follow-up with OB/GYN.  She continues to report heavy menstruation which is irregular.  I have recommended to her once again today to hold tamoxifen, follow-up with GYN for additional instructions.  Also discussed this with her husband today.   She did write up notes to remind herself. Most recent mammogram from September at [facility] with no evidence of malignancy.  For vasomotor symptoms of menopause, have once again recommended she try the oxybutynin.  I also sent an in basket message to Dr. Lanny Cramp from [facility]  Total time spent: 30 minutes  *Total Encounter Time as defined by the Centers for Medicare and Medicaid Services includes, in addition to the face-to-face time of a patient visit (documented in the note above) non-face-to-face time: obtaining and reviewing outside history, ordering and reviewing medications, tests or procedures, care coordination (communications with other health care professionals or caregivers) and documentation in the medical record.

## 2022-09-17 ENCOUNTER — Inpatient Hospital Stay: Payer: BC Managed Care – PPO | Attending: Hematology and Oncology | Admitting: Hematology and Oncology

## 2022-09-17 ENCOUNTER — Encounter: Payer: Self-pay | Admitting: Hematology and Oncology

## 2022-09-17 DIAGNOSIS — D0511 Intraductal carcinoma in situ of right breast: Secondary | ICD-10-CM | POA: Diagnosis not present

## 2022-09-17 NOTE — Progress Notes (Signed)
[place]  Telephone:(336) [phone] Fax:(336) [phone]    ID: Kathy Hickman DOB: [birth year]  MR#: [id number]  [id number]  Patient Care Team: Donnajean Lopes, MD as PCP - General (Internal Medicine) Erroll Luna, MD as Consulting Physician (General Surgery) Kyung Rudd, MD as Consulting Physician (Radiation Oncology) Armandina Stammer, DO as Consulting Physician (Obstetrics and Gynecology) Benay Pike, MD as Consulting Physician (Hematology and Oncology) Benay Pike, MD  CHIEF COMPLAINT: noninvasive breast cancer, estrogen receptor positive   HISTORY OF CURRENT ILLNESS:  "Kathy Hickman" had her first ever screening mammography on 03/21/2021 showing a possible abnormality in the right breast. She underwent right diagnostic mammography with tomography and right breast ultrasonography at [facility] on 04/11/2021 showing: breast density category C; 0.8 cm irregular mass and associated distortion in right breast at 10 o'clock; no axillary lymphadenopathy.  Accordingly on 04/17/2021 she proceeded to biopsy of the right breast area in question. 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 Prognostic indicators significant for: estrogen receptor, 85% positive and progesterone receptor, 10% positive, both with strong staining intensity.    Cancer Staging  Ductal carcinoma in situ (DCIS) of right breast Staging form: Breast, AJCC 8th Edition - Clinical stage from 04/25/2021: Stage 0 (cTis (DCIS), cN0, cM0, ER+, PR+, HER2: Not Assessed) - Unsigned Stage prefix: Initial diagnosis Nuclear grade: G2 Laterality: Right Staged by: Pathologist and managing physician Stage used in treatment planning: Yes National guidelines used in treatment planning: Yes Type of national guideline used in treatment planning: NCCN  Patient underwent right breast seed  localized lumpectomy on 05/10/2021 for right breast ductal carcinoma in situ. Surgical pathology showed residual complex sclerosing lesion with florid usual ductal hyperplasia, no residual ductal carcinoma identified.   She completed adjuvant radiation  She is on antiestrogen therapy with tamoxifen.  INTERVAL HISTORY: She is here for telephone visit.  Once again since her last call, she did not make an appointment with her OB/GYN.  She continues to complain of heavy menstruation.  She apparently held the tamoxifen about 2 weeks ago when we last spoke to her.  She tells me that she is scared of going back to the [facility] office but she will do her best to get another appointment.  COVID 19 VACCINATION STATUS: not vaccinated as of September 2022; has not had COVID   PAST MEDICAL HISTORY: Past Medical History:  Diagnosis Date   Allergy    Anxiety    Arthritis    osteoarthritis   Asthma    Carpal tunnel syndrome    Depression    Fibromyalgia    Hypertension    Pain, chronic    Pneumonia     PAST SURGICAL HISTORY: Past Surgical History:  Procedure Laterality Date   BREAST LUMPECTOMY WITH RADIOACTIVE SEED LOCALIZATION Right 05/10/2021   Procedure: RIGHT BREAST LUMPECTOMY WITH RADIOACTIVE SEED LOCALIZATION;  Surgeon: Erroll Luna, MD;  Location: [facility];  Service: General;  Laterality: Right;   [place]  08/05/2000   Dr. Ruthann Cancer    FAMILY HISTORY: Family History  Problem Relation Age of Onset   Anxiety disorder Mother    Breast cancer Paternal Aunt 48   Colon cancer Paternal Aunt 68   Pancreatic cancer Maternal Grandmother 91   Lung cancer Paternal Grandfather        dx. 60s, smoked   Asthma Son  Allergies Son    Her father died at age 34 from alcohol, drug issues. Her mother is currently 2 years old (as of 04/2021). Kathy Hickman has one brother (and no sisters). She reports lung cancer in her paternal grandfather in his 19's,  breast cancer in a paternal aunt at age 22, colon cancer in a different paternal aunt at age 8, and pancreatic cancer in her maternal grandmother at age 69.   GYNECOLOGIC HISTORY:  No LMP recorded. Menarche: 42 years old Age at first live birth: 42 years old [place] P 1 LMP 04/07/2021 Contraceptive used "on and off for past 15 years" HRT n/a  Hysterectomy? no BSO? no   SOCIAL HISTORY: (updated 04/2021)  [name] is currently working for [employer], where she has worked for the last 20 years. She is single (divorced). She lives at home with her mother, who cleans houses part-time. Son Kathy Hickman, age 42, works in Stage manager in [place] with his father. Haylynn is not a Designer, fashion/clothing.    ADVANCED DIRECTIVES: not in place   HEALTH MAINTENANCE: Social History   Tobacco Use   Smoking status: Never   Smokeless tobacco: Never  Vaping Use   Vaping Use: Never used  Substance Use Topics   Alcohol use: No   Drug use: No    Comment: narcotic dependence     Colonoscopy: 04/2009 (Dr. Sharlett Iles)  PAP: 03/2021  Bone density: unsure   Allergies  Allergen Reactions   Amoxicillin Hives, Shortness Of Breath and Swelling   Sulfonamide Derivatives Hives, Shortness Of Breath and Swelling   Latex Rash    Current Outpatient Medications  Medication Sig Dispense Refill   ALPRAZolam (XANAX) 0.5 MG tablet Take 0.5 mg by mouth daily.     Ascorbic Acid (VITAMIN C) 1000 MG tablet Take 1,000 mg by mouth daily.     atenolol (TENORMIN) 50 MG tablet Take 50 mg by mouth daily.     cetirizine-pseudoephedrine (ZYRTEC-D) 5-120 MG per tablet Take 1 tablet by mouth daily. allergies     Cholecalciferol (D3-1000 PO) Take by mouth.     clomiPRAMINE (ANAFRANIL) 25 MG capsule 25 mg.     Cod Liver Oil CAPS Take by mouth.     docusate sodium (COLACE) 100 MG capsule Take 1 capsule by mouth every other day.     ferrous gluconate (FERGON) 240 (27 FE) MG tablet 1 tablet daily.     fluticasone (FLONASE) 50  MCG/ACT nasal spray Place 1 spray into both nostrils daily.     gabapentin (NEURONTIN) 300 MG capsule Take 2 capsules by mouth in the morning and at bedtime.     HYDROmorphone HCl ER 16 MG TB24 Take 1 tablet by mouth daily at 4 PM.     ibuprofen (ADVIL) 800 MG tablet Take 1 tablet (800 mg total) by mouth every 8 (eight) hours as needed. 30 tablet 0   LYSINE PO Take by mouth.     montelukast (SINGULAIR) 10 MG tablet Take 10 mg by mouth daily.     Multiple Vitamins-Minerals (MULTIVITAMINS THER. W/MINERALS) TABS Take 1 tablet by mouth daily.       oxybutynin (DITROPAN) 5 MG tablet Take 0.5 tablets (2.5 mg total) by mouth daily. 30 tablet 2   tamoxifen (NOLVADEX) 20 MG tablet Take 0.5 tablets (10 mg total) by mouth daily. 30 tablet 5   tiZANidine (ZANAFLEX) 4 MG capsule Take 4 mg by mouth in the morning and at bedtime.     topiramate (TOPAMAX) 25 MG tablet Take 2  tablets by mouth daily.     No current facility-administered medications for this visit.    Left  There were no vitals filed for this visit.     There is no height or weight on file to calculate BMI.   Wt Readings from Last 3 Encounters:  08/30/22 244 lb 4.8 oz (110.8 kg)  02/28/22 237 lb 11.2 oz (107.8 kg)  08/31/21 238 lb 4.8 oz (108.1 kg)      ECOG FS:1 - Symptomatic but completely ambulatory  PE deferred in lieu of telephone visit.  LAB RESULTS:  CMP     Component Value Date/Time   NA 140 04/25/2021 0828   K 4.6 04/25/2021 0828   CL 110 04/25/2021 0828   CO2 21 (L) 04/25/2021 0828   GLUCOSE 94 04/25/2021 0828   BUN 20 04/25/2021 0828   CREATININE 0.95 04/25/2021 0828   CALCIUM 9.1 04/25/2021 0828   PROT 7.2 04/25/2021 0828   ALBUMIN 4.3 04/25/2021 0828   AST 17 04/25/2021 0828   ALT 19 04/25/2021 0828   ALKPHOS 70 04/25/2021 0828   BILITOT 0.3 04/25/2021 0828   GFRNONAA >60 04/25/2021 0828   GFRAA >90 07/15/2011 0436    No results found for: "TOTALPROTELP", "ALBUMINELP", "A1GS", "A2GS", "BETS",  "BETA2SER", "GAMS", "MSPIKE", "SPEI"  Lab Results  Component Value Date   WBC 7.4 04/25/2021   NEUTROABS 3.5 04/25/2021   HGB 12.9 04/25/2021   HCT 38.4 04/25/2021   MCV 87.1 04/25/2021   PLT 279 04/25/2021    No results found for: "LABCA2"  No components found for: "[id number]"  No results for input(s): "INR" in the last 168 hours.  No results found for: "LABCA2"  No results found for: "[id number]"  No results found for: "CAN125"  No results found for: "CAN153"  No results found for: "CA2729"  No components found for: "HGQUANT"  No results found for: "CEA1", "CEA" / No results found for: "CEA1", "CEA"   No results found for: "AFPTUMOR"  No results found for: "CHROMOGRNA"  No results found for: "KPAFRELGTCHN", "LAMBDASER", "KAPLAMBRATIO" (kappa/lambda light chains)  No results found for: "HGBA", "HGBA2QUANT", "HGBFQUANT", "HGBSQUAN" (Hemoglobinopathy evaluation)   No results found for: "LDH"  Lab Results  Component Value Date   IRON <10 (L) 07/14/2011   TIBC Not calculated due to Iron <10. 07/14/2011   IRONPCTSAT Not calculated due to Iron <10. 07/14/2011   (Iron and TIBC)  Lab Results  Component Value Date   FERRITIN 116 07/14/2011    Urinalysis    Component Value Date/Time   COLORURINE YELLOW 03/17/2011 [place] 03/17/2011 1517   LABSPEC 1.009 03/17/2011 1517   PHURINE 6.5 03/17/2011 1517   GLUCOSEU NEGATIVE 03/17/2011 1517   HGBUR NEGATIVE 03/17/2011 1517   BILIRUBINUR NEGATIVE 03/17/2011 1517   KETONESUR NEGATIVE 03/17/2011 1517   PROTEINUR NEGATIVE 03/17/2011 1517   UROBILINOGEN 0.2 03/17/2011 1517   NITRITE NEGATIVE 03/17/2011 1517   LEUKOCYTESUR NEGATIVE 03/17/2011 1517    STUDIES: No results found.  ELIGIBLE FOR AVAILABLE RESEARCH PROTOCOL: no  ASSESSMENT: 42 y.o. [place] woman status post a right breast biopsy on 04/17/2021 for ductal carcinoma in situ, grade 2, with possible microinvasion; estrogen and progesterone  receptor positive.  She underwent definitive surgery, right breast lumpectomy and no evidence of residual DCIS on final pathology.  She completed adjuvant radiation on August 17, 2021.  She tolerated it well.  She couldn't tolerate full dose of tamoxifen 20 mg, so hence switched to 10 mg During her  last visit, we recommended Ob follow up for heavy menstruation and to hold Tamoxifen. She once again didn't follow up with her Obgyn and once again complains of heavy menstruation. She today tells me that the last thing she went to see Dr Lanny Cramp, she had breast cancer, so she is very worried about going back. I once again informed her that she will have to follow-up with OB/GYN especially because she continues to report heavier menstruation and since she was on tamoxifen.  I have offered to send a referral to a different OB/GYN if she does not want to go back to that office.  She reassured me that she will call them today after lunchtime to get an appointment.  I will catch up with her again in about a month.  She understands it is best not to stay off of tamoxifen unless absolutely needed. Most recent mammogram from September at [facility] with no evidence of malignancy. I also sent an in basket message to Dr. Lanny Cramp from [facility]  Total time spent: 10 minutes  I connected with  Kathy Hickman on 09/17/22 by a telephone application and verified that I am speaking with the correct person using two identifiers.   I discussed the limitations of evaluation and management by telemedicine. The patient expressed understanding and agreed to proceed.  *Total Encounter Time as defined by the Centers for Medicare and Medicaid Services includes, in addition to the face-to-face time of a patient visit (documented in the note above) non-face-to-face time: obtaining and reviewing outside history, ordering and reviewing medications, tests or procedures, care coordination (communications with other health care professionals or  caregivers) and documentation in the medical record.

## 2022-10-02 ENCOUNTER — Telehealth: Payer: Self-pay | Admitting: *Deleted

## 2022-10-02 NOTE — Telephone Encounter (Signed)
This RN contacted pt's GYN MD's office- per results received of path from endometrial bx - showing benign reading of endometrial polyps relating to reported heavy menstruation.  This RN was connected to [place]- in the triage- discussed above and need for Dr Gardiner Barefoot recommendation regarding the above and pt's current use of tamoxifen.  Lucky Rathke will send inquiry to Dr Lanny Cramp for recommendation.  This RN's number given for return communication.

## 2022-10-02 NOTE — Telephone Encounter (Signed)
See other entry 

## 2022-10-03 ENCOUNTER — Telehealth: Payer: Self-pay | Admitting: *Deleted

## 2022-10-03 NOTE — Telephone Encounter (Signed)
This RN received return call from [place] with Dr Lanny Cramp (gyn) stating per Dr Gardiner Barefoot review of recent endometrial path with benign reading - there is no contraindication for continued use of tamoxifen.  This RN will forward this message to MD for review of communication.

## 2022-10-04 ENCOUNTER — Encounter: Payer: Self-pay | Admitting: Hematology and Oncology

## 2022-11-14 ENCOUNTER — Other Ambulatory Visit (HOSPITAL_COMMUNITY): Payer: Self-pay

## 2022-11-14 MED ORDER — OXYCODONE HCL 10 MG PO TABS
ORAL_TABLET | ORAL | 0 refills | Status: AC
  Filled 2022-11-14: qty 150, 30d supply, fill #0

## 2022-11-25 ENCOUNTER — Other Ambulatory Visit (HOSPITAL_COMMUNITY): Payer: Self-pay

## 2022-11-25 MED ORDER — MORPHINE SULFATE ER 30 MG PO CP24
30.0000 mg | ORAL_CAPSULE | Freq: Two times a day (BID) | ORAL | 0 refills | Status: AC
  Filled 2022-11-25: qty 60, 30d supply, fill #0

## 2022-12-03 ENCOUNTER — Other Ambulatory Visit (HOSPITAL_BASED_OUTPATIENT_CLINIC_OR_DEPARTMENT_OTHER): Payer: Self-pay

## 2022-12-03 DIAGNOSIS — G4733 Obstructive sleep apnea (adult) (pediatric): Secondary | ICD-10-CM

## 2022-12-10 ENCOUNTER — Other Ambulatory Visit (HOSPITAL_COMMUNITY): Payer: Self-pay

## 2022-12-10 MED ORDER — MORPHINE SULFATE ER 30 MG PO CP24
30.0000 mg | ORAL_CAPSULE | Freq: Two times a day (BID) | ORAL | 0 refills | Status: AC
  Filled 2022-12-10: qty 60, 30d supply, fill #0

## 2022-12-11 ENCOUNTER — Other Ambulatory Visit (HOSPITAL_COMMUNITY): Payer: Self-pay

## 2022-12-12 ENCOUNTER — Other Ambulatory Visit (HOSPITAL_COMMUNITY): Payer: Self-pay

## 2022-12-20 ENCOUNTER — Ambulatory Visit: Payer: BC Managed Care – PPO | Admitting: Hematology and Oncology

## 2022-12-20 ENCOUNTER — Telehealth: Payer: Self-pay | Admitting: Hematology and Oncology

## 2022-12-26 ENCOUNTER — Inpatient Hospital Stay: Payer: BC Managed Care – PPO | Admitting: Hematology and Oncology

## 2023-01-13 ENCOUNTER — Other Ambulatory Visit (HOSPITAL_COMMUNITY): Payer: Self-pay

## 2023-01-13 MED ORDER — MORPHINE SULFATE ER 30 MG PO CP24
ORAL_CAPSULE | ORAL | 0 refills | Status: AC
  Filled 2023-01-13: qty 60, 30d supply, fill #0

## 2023-01-14 ENCOUNTER — Other Ambulatory Visit (HOSPITAL_COMMUNITY): Payer: Self-pay

## 2023-01-17 ENCOUNTER — Other Ambulatory Visit (HOSPITAL_COMMUNITY): Payer: Self-pay

## 2023-01-17 MED ORDER — MORPHINE SULFATE ER 30 MG PO CP24
30.0000 mg | ORAL_CAPSULE | Freq: Two times a day (BID) | ORAL | 0 refills | Status: DC
  Filled 2023-02-12: qty 60, 30d supply, fill #0

## 2023-01-17 MED ORDER — NALOXONE HCL 4 MG/0.1ML NA LIQD: NASAL | 1 refills | Status: AC

## 2023-01-20 ENCOUNTER — Encounter: Payer: Self-pay | Admitting: Hematology and Oncology

## 2023-01-20 ENCOUNTER — Inpatient Hospital Stay: Payer: BC Managed Care – PPO | Attending: Hematology and Oncology | Admitting: Hematology and Oncology

## 2023-01-20 ENCOUNTER — Other Ambulatory Visit: Payer: Self-pay

## 2023-01-20 VITALS — BP 136/79 | HR 95 | Temp 97.8°F | Resp 20 | Wt 255.1 lb

## 2023-01-20 DIAGNOSIS — Z170 Estrogen receptor positive status [ER+]: Secondary | ICD-10-CM | POA: Insufficient documentation

## 2023-01-20 DIAGNOSIS — D0511 Intraductal carcinoma in situ of right breast: Secondary | ICD-10-CM | POA: Diagnosis not present

## 2023-01-20 DIAGNOSIS — Z923 Personal history of irradiation: Secondary | ICD-10-CM | POA: Insufficient documentation

## 2023-01-20 DIAGNOSIS — Z79810 Long term (current) use of selective estrogen receptor modulators (SERMs): Secondary | ICD-10-CM | POA: Insufficient documentation

## 2023-01-20 MED ORDER — TAMOXIFEN CITRATE 20 MG PO TABS: 20.0000 mg | ORAL_TABLET | Freq: Every day | ORAL | 5 refills | Status: DC

## 2023-01-20 NOTE — Progress Notes (Signed)
[facility]  [phone] [phone]    ID: Kathy Hickman DOB: October 23, 1980  MR#: [id number]  JYN#:[id number]  Patient Care Team: Garlan Fillers, MD as PCP - General (Internal Medicine) Harriette Bouillon, MD as Consulting Physician (General Surgery) Dorothy Puffer, MD as Consulting Physician (Radiation Oncology) Toy Baker, DO as Consulting Physician (Obstetrics and Gynecology) Rachel Moulds, MD as Consulting Physician (Hematology and Oncology) Rachel Moulds, MD  CHIEF COMPLAINT: noninvasive breast cancer, estrogen receptor positive   HISTORY OF CURRENT ILLNESS:  "Kathy Hickman" had her first ever screening mammography on 03/21/2021 showing a possible abnormality in the right breast. She underwent right diagnostic mammography with tomography and right breast ultrasonography at [facility] on 04/11/2021 showing: breast density category C; 0.8 cm irregular mass and associated distortion in right breast at 10 o'clock; no axillary lymphadenopathy.  Accordingly on 04/17/2021 she proceeded to biopsy of the right breast area in question. 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 Prognostic indicators significant for: estrogen receptor, 85% positive and progesterone receptor, 10% positive, both with strong staining intensity.    Cancer Staging  Ductal carcinoma in situ (DCIS) of right breast Staging form: Breast, AJCC 8th Edition - Clinical stage from 04/25/2021: Stage 0 (cTis (DCIS), cN0, cM0, ER+, PR+, HER2: Not Assessed) - Unsigned Stage prefix: Initial diagnosis Nuclear grade: G2 Laterality: Right Staged by: Pathologist and managing physician Stage used in treatment planning: Yes National guidelines used in treatment planning: Yes Type of national guideline used in treatment planning: NCCN  Patient underwent right breast seed  localized lumpectomy on 05/10/2021 for right breast ductal carcinoma in situ. Surgical pathology showed residual complex sclerosing lesion with florid usual ductal hyperplasia, no residual ductal carcinoma identified.   She completed adjuvant radiation  She is on antiestrogen therapy with tamoxifen.  INTERVAL HISTORY: Kathy Hickman is now on adjuvant tamoxifen.  Since her last visit here she went to see her OB/GYN, had some endometrial biopsies which showed benign pathology.  She is currently on 10 mg of tamoxifen.  She is also on Cymbalta 60 mg.  She tells me that she feels well now that she does not have to bleed so bad.  She has more energy.  She only complains of ongoing vasomotor symptoms like severe hot flashes.  Rest of the pertinent 10 point ROS reviewed and negative.  She is due for her mammogram in September, wants to switch to breast center.   COVID 19 VACCINATION STATUS: not vaccinated as of September 2022; has not had COVID   PAST MEDICAL HISTORY: Past Medical History:  Diagnosis Date   Allergy    Anxiety    Arthritis    osteoarthritis   Asthma    Carpal tunnel syndrome    Depression    Fibromyalgia    Hypertension    Pain, chronic    Pneumonia     PAST SURGICAL HISTORY: Past Surgical History:  Procedure Laterality Date   BREAST LUMPECTOMY WITH RADIOACTIVE SEED LOCALIZATION Right 05/10/2021   Procedure: RIGHT BREAST LUMPECTOMY WITH RADIOACTIVE SEED LOCALIZATION;  Surgeon: Harriette Bouillon, MD;  Location: [facility];  Service: General;  Laterality: Right;   CARPAL TUNNEL RELEASE     CESAREAN SECTION  08/05/2000   Dr. Gaynell Face    FAMILY HISTORY: Family History  Problem Relation Age of Onset   Anxiety disorder Mother    Breast cancer Paternal Aunt 54  Colon cancer Paternal Aunt 10   Pancreatic cancer Maternal Grandmother 90   Lung cancer Paternal Grandfather        dx. 38s, smoked   Asthma Son    Allergies Son    Her father died at age 49 from  alcohol, drug issues. Her mother is currently 53 years old (as of 04/2021). Niayla has one brother (and no sisters). She reports lung cancer in her paternal grandfather in his 72's, breast cancer in a paternal aunt at age 22, colon cancer in a different paternal aunt at age 91, and pancreatic cancer in her maternal grandmother at age 29.   GYNECOLOGIC HISTORY:  No LMP recorded. Menarche: 42 years old Age at first live birth: 42 years old GX P 1 LMP 04/07/2021 Contraceptive used "on and off for past 15 years" HRT n/a  Hysterectomy? no BSO? no   SOCIAL HISTORY: (updated 04/2021)  Margene is currently working for [employer], where she has worked for the last 20 years. She is single (divorced). She lives at home with her mother, who cleans houses part-time. Son Sheria Lang, age 45, works in Scientist, water quality in [place] with his father. Johnice is not a Actor.    ADVANCED DIRECTIVES: not in place   HEALTH MAINTENANCE: Social History   Tobacco Use   Smoking status: Never   Smokeless tobacco: Never  Vaping Use   Vaping Use: Never used  Substance Use Topics   Alcohol use: No   Drug use: No    Comment: narcotic dependence     Colonoscopy: 04/2009 (Dr. Jarold Motto)  PAP: 03/2021  Bone density: unsure   Allergies  Allergen Reactions   Amoxicillin Hives, Shortness Of Breath and Swelling   Sulfonamide Derivatives Hives, Shortness Of Breath and Swelling   Latex Rash    Current Outpatient Medications  Medication Sig Dispense Refill   ALPRAZolam (XANAX) 0.5 MG tablet Take 0.5 mg by mouth daily.     Ascorbic Acid (VITAMIN C) 1000 MG tablet Take 1,000 mg by mouth daily.     atenolol (TENORMIN) 50 MG tablet Take 50 mg by mouth daily.     cetirizine-pseudoephedrine (ZYRTEC-D) 5-120 MG per tablet Take 1 tablet by mouth daily. allergies     Cholecalciferol (D3-1000 PO) Take by mouth.     clomiPRAMINE (ANAFRANIL) 25 MG capsule 25 mg.     Cod Liver Oil CAPS Take by mouth.      docusate sodium (COLACE) 100 MG capsule Take 1 capsule by mouth every other day.     ferrous gluconate (FERGON) 240 (27 FE) MG tablet 1 tablet daily.     fluticasone (FLONASE) 50 MCG/ACT nasal spray Place 1 spray into both nostrils daily.     gabapentin (NEURONTIN) 300 MG capsule Take 2 capsules by mouth in the morning and at bedtime.     HYDROmorphone HCl ER 16 MG TB24 Take 1 tablet by mouth daily at 4 PM.     ibuprofen (ADVIL) 800 MG tablet Take 1 tablet (800 mg total) by mouth every 8 (eight) hours as needed. 30 tablet 0   LYSINE PO Take by mouth.     montelukast (SINGULAIR) 10 MG tablet Take 10 mg by mouth daily.     morphine (KADIAN) 30 MG 24 hr capsule Take 1 capsule (30 mg total) by mouth every 12 (twelve) hours. 60 capsule 0   morphine (KADIAN) 30 MG 24 hr capsule Take 1 capsule (30 mg total) by mouth every 12 (twelve) hours. 60 capsule 0  morphine (KADIAN) 30 MG 24 hr capsule Take 1 capsule by mouth every 12 hours 60 capsule 0   morphine (KADIAN) 30 MG 24 hr capsule Take 1 capsule (30 mg total) by mouth every 12 (twelve) hours. 60 capsule 0   Multiple Vitamins-Minerals (MULTIVITAMINS THER. W/MINERALS) TABS Take 1 tablet by mouth daily.       naloxone (NARCAN) nasal spray 4 mg/0.1 mL Use 1 spray into 1 nostril as needed --may repeat dose every 2 - 3 minutes until patient responsive or EMS arrives 1 each 1   oxybutynin (DITROPAN) 5 MG tablet Take 0.5 tablets (2.5 mg total) by mouth daily. 30 tablet 2   Oxycodone HCl 10 MG TABS Take 1 tablet by mouth 5 times a day as needed for pain 150 tablet 0   tamoxifen (NOLVADEX) 20 MG tablet Take 0.5 tablets (10 mg total) by mouth daily. 30 tablet 5   tiZANidine (ZANAFLEX) 4 MG capsule Take 4 mg by mouth in the morning and at bedtime.     topiramate (TOPAMAX) 25 MG tablet Take 2 tablets by mouth daily.     No current facility-administered medications for this visit.    Left  Vitals:   01/20/23 0935  BP: 136/79  Pulse: 95  Resp: 20  Temp:  97.8 F (36.6 C)  SpO2: 94%      Body mass index is 43.79 kg/m.   Wt Readings from Last 3 Encounters:  01/20/23 255 lb 1.6 oz (115.7 kg)  08/30/22 244 lb 4.8 oz (110.8 kg)  02/28/22 237 lb 11.2 oz (107.8 kg)      ECOG FS:1 - Symptomatic but completely ambulatory  Physical Exam Constitutional:      Appearance: Normal appearance.  Cardiovascular:     Rate and Rhythm: Normal rate and regular rhythm.     Pulses: Normal pulses.     Heart sounds: Normal heart sounds.  Chest:     Comments: Bilateral breasts inspected.  Scar tissue noted in the right breast at the previous site of lumpectomy.  No other palpable masses.  No regional adenopathy.  Left breast normal to inspection and palpation. Musculoskeletal:     Cervical back: Normal range of motion and neck supple. No rigidity.  Lymphadenopathy:     Cervical: No cervical adenopathy.  Neurological:     Mental Status: She is alert.      LAB RESULTS:  CMP     Component Value Date/Time   NA 140 04/25/2021 0828   K 4.6 04/25/2021 0828   CL 110 04/25/2021 0828   CO2 21 (L) 04/25/2021 0828   GLUCOSE 94 04/25/2021 0828   BUN 20 04/25/2021 0828   CREATININE 0.95 04/25/2021 0828   CALCIUM 9.1 04/25/2021 0828   PROT 7.2 04/25/2021 0828   ALBUMIN 4.3 04/25/2021 0828   AST 17 04/25/2021 0828   ALT 19 04/25/2021 0828   ALKPHOS 70 04/25/2021 0828   BILITOT 0.3 04/25/2021 0828   GFRNONAA >60 04/25/2021 0828   GFRAA >90 07/15/2011 0436    No results found for: "TOTALPROTELP", "ALBUMINELP", "A1GS", "A2GS", "BETS", "BETA2SER", "GAMS", "MSPIKE", "SPEI"  Lab Results  Component Value Date   WBC 7.4 04/25/2021   NEUTROABS 3.5 04/25/2021   HGB 12.9 04/25/2021   HCT 38.4 04/25/2021   MCV 87.1 04/25/2021   PLT 279 04/25/2021    No results found for: "LABCA2"  No components found for: "ZOXWRU045"  No results for input(s): "INR" in the last 168 hours.  No results found for: "LABCA2"  No results found for: "CAN199"  No  results found for: "CAN125"  No results found for: "CAN153"  No results found for: "CA2729"  No components found for: "HGQUANT"  No results found for: "CEA1", "CEA" / No results found for: "CEA1", "CEA"   No results found for: "AFPTUMOR"  No results found for: "CHROMOGRNA"  No results found for: "KPAFRELGTCHN", "LAMBDASER", "KAPLAMBRATIO" (kappa/lambda light chains)  No results found for: "HGBA", "HGBA2QUANT", "HGBFQUANT", "HGBSQUAN" (Hemoglobinopathy evaluation)   No results found for: "LDH"  Lab Results  Component Value Date   IRON <10 (L) 07/14/2011   TIBC Not calculated due to Iron <10. 07/14/2011   IRONPCTSAT Not calculated due to Iron <10. 07/14/2011   (Iron and TIBC)  Lab Results  Component Value Date   FERRITIN 116 07/14/2011    Urinalysis    Component Value Date/Time   COLORURINE YELLOW 03/17/2011 1517   APPEARANCEUR CLEAR 03/17/2011 1517   LABSPEC 1.009 03/17/2011 1517   PHURINE 6.5 03/17/2011 1517   GLUCOSEU NEGATIVE 03/17/2011 1517   HGBUR NEGATIVE 03/17/2011 1517   BILIRUBINUR NEGATIVE 03/17/2011 1517   KETONESUR NEGATIVE 03/17/2011 1517   PROTEINUR NEGATIVE 03/17/2011 1517   UROBILINOGEN 0.2 03/17/2011 1517   NITRITE NEGATIVE 03/17/2011 1517   LEUKOCYTESUR NEGATIVE 03/17/2011 1517    STUDIES: No results found.  ELIGIBLE FOR AVAILABLE RESEARCH PROTOCOL: no  ASSESSMENT: 42 y.o. Kathy Hickman woman status post a right breast biopsy on 04/17/2021 for ductal carcinoma in situ, grade 2, with possible microinvasion; estrogen and progesterone receptor positive.  She underwent definitive surgery, right breast lumpectomy and no evidence of residual DCIS on final pathology.  She completed adjuvant radiation on August 17, 2021.  She tolerated it well.  She couldn't tolerate full dose of tamoxifen 20 mg, so hence switched to 10 mg During her last visit, we recommended Ob follow up for heavy menstruation and to hold Tamoxifen. She had endometrial biopsies  which did not show any evidence of malignancy.  She had benign polypoidal lesions hence she is back on tamoxifen 10 mg.  She is now on Cymbalta 60 mg.  We have discussed the interaction between tamoxifen and Cymbalta which reduces the effectiveness of tamoxifen.  She has wanted to try and up the dose of tamoxifen back to 20 mg daily.  If she does not tolerate this well, she will keep [place] posted.  She wants to switch mammograms to the breast center, next one ordered for September of this year.  No concerns on exam today.  Will plan to see her back in 6 months or sooner as needed.  Total time: 30 min *Total Encounter Time as defined by the Centers for Medicare and Medicaid Services includes, in addition to the face-to-face time of a patient visit (documented in the note above) non-face-to-face time: obtaining and reviewing outside history, ordering and reviewing medications, tests or procedures, care coordination (communications with other health care professionals or caregivers) and documentation in the medical record.

## 2023-02-03 ENCOUNTER — Other Ambulatory Visit (HOSPITAL_COMMUNITY): Payer: Self-pay

## 2023-02-03 MED ORDER — MORPHINE SULFATE ER 30 MG PO CP24
30.0000 mg | ORAL_CAPSULE | Freq: Two times a day (BID) | ORAL | 0 refills | Status: DC
  Filled 2023-02-03: qty 60, 30d supply, fill #0

## 2023-02-04 ENCOUNTER — Other Ambulatory Visit (HOSPITAL_COMMUNITY): Payer: Self-pay

## 2023-02-11 ENCOUNTER — Other Ambulatory Visit (HOSPITAL_COMMUNITY): Payer: Self-pay

## 2023-02-12 ENCOUNTER — Other Ambulatory Visit: Payer: Self-pay

## 2023-02-12 ENCOUNTER — Other Ambulatory Visit (HOSPITAL_COMMUNITY): Payer: Self-pay

## 2023-02-12 MED ORDER — MORPHINE SULFATE ER 30 MG PO TBCR
30.0000 mg | EXTENDED_RELEASE_TABLET | Freq: Two times a day (BID) | ORAL | 0 refills | Status: DC
  Filled 2023-02-12: qty 60, 30d supply, fill #0

## 2023-02-13 ENCOUNTER — Other Ambulatory Visit (HOSPITAL_COMMUNITY): Payer: Self-pay

## 2023-04-14 ENCOUNTER — Other Ambulatory Visit: Payer: Self-pay | Admitting: Obstetrics and Gynecology

## 2023-04-22 ENCOUNTER — Encounter (HOSPITAL_BASED_OUTPATIENT_CLINIC_OR_DEPARTMENT_OTHER): Payer: Self-pay | Admitting: Obstetrics and Gynecology

## 2023-04-22 ENCOUNTER — Other Ambulatory Visit: Payer: Self-pay

## 2023-04-22 DIAGNOSIS — Z01818 Encounter for other preprocedural examination: Secondary | ICD-10-CM | POA: Diagnosis present

## 2023-04-22 NOTE — Progress Notes (Signed)
Spoke w/ via phone for pre-op interview---[name] needs dos----UPT       Lab results------05/02/23 lab appt for cbc, type & screen, bmp,ekg COVID test -----patient states asymptomatic no test needed Arrive at -------0630 on Wednesday, 05/07/2023 NPO after MN NO Solid Food.  Clear liquids from MN until---0530 Med rec completed Medications to take morning of surgery -----Xanax, Atenolol, Zyrtec-D, Clomipramine, Cymbalta, Levothyroxine, Gabapentin, Morphine, Singulair, Oxycodone, Zanaflex, Topamax Patient states that she was instructed to stop ASA 81mg  for 2 weeks prior to surgery per her surgeon. Diabetic medication -----n/a Patient instructed no nail polish to be worn day of surgery Patient instructed to bring photo id and insurance card day of surgery Patient aware to have Driver (ride ) / caregiver    for 24 hours after surgery - fiance and mother Patient Special Instructions -----Extended / overnight stay instructions given. Pre-Op special Instructions -----none Patient verbalized understanding of instructions that were given at this phone interview. Patient denies chest pain, sob, fever, cough at the interview.   PCP: Dr. Ivery Quale with [facility], LOV 03/13/2023 in chart.

## 2023-04-22 NOTE — Progress Notes (Signed)
Your procedure is scheduled on Wednesday, 05/07/2023  Report to [facility] AT  6:30 AM.   Call this number if you have problems the morning of surgery  :[phone].   OUR ADDRESS IS [address].  WE ARE LOCATED IN [facility].  PLEASE BRING YOUR INSURANCE CARD AND PHOTO ID DAY OF SURGERY.  ONLY 2 PEOPLE ARE ALLOWED IN  WAITING  ROOM                                      REMEMBER:  DO NOT EAT FOOD, CANDY GUM OR MINTS  AFTER MIDNIGHT THE NIGHT BEFORE YOUR SURGERY . YOU MAY HAVE CLEAR LIQUIDS FROM MIDNIGHT THE NIGHT BEFORE YOUR SURGERY UNTIL  5:30 AM. NO CLEAR LIQUIDS AFTER   5:30 AM DAY OF SURGERY.  YOU MAY  BRUSH YOUR TEETH MORNING OF SURGERY AND RINSE YOUR MOUTH OUT, NO CHEWING GUM CANDY OR MINTS.     CLEAR LIQUID DIET    Allowed      Water                                                                   Coffee and tea, regular and decaf  (NO cream or milk products of any type, may sweeten)                         Carbonated beverages, regular and diet                                    Sports drinks like Gatorade _____________________________________________________________________     TAKE ONLY THESE MEDICATIONS MORNING OF SURGERY: Xanax, Atenolol, Zyrtec-D, Clomipramine, Cymbalta, Levothyroxine, Gabapentin, Morphine, Singulair, Oxycodone, Zanaflex, Topamax                                        DO NOT WEAR JEWERLY/  METAL/  PIERCINGS (INCLUDING NO PLASTIC PIERCINGS) DO NOT WEAR LOTIONS, POWDERS, PERFUMES OR NAIL POLISH ON YOUR FINGERNAILS. TOENAIL POLISH IS OK TO WEAR. DO NOT SHAVE FOR 48 HOURS PRIOR TO DAY OF SURGERY.  CONTACTS, GLASSES, OR DENTURES MAY NOT BE WORN TO SURGERY.  REMEMBER: NO SMOKING, VAPING ,  DRUGS OR ALCOHOL FOR 24 HOURS BEFORE YOUR SURGERY.                                    [place] IS NOT RESPONSIBLE  FOR ANY BELONGINGS.                                                                    Marland Kitchen  [place] - Preparing for Surgery Before surgery, you can play an important role.  Because skin is not sterile, your skin needs to be as free of germs as possible.  You can reduce the number of germs on your skin by washing with CHG (chlorahexidine gluconate) soap before surgery.  CHG is an antiseptic cleaner which kills germs and bonds with the skin to continue killing germs even after washing. Please DO NOT use if you have an allergy to CHG or antibacterial soaps.  If your skin becomes reddened/irritated stop using the CHG and inform your nurse when you arrive at Short Stay. Do not shave (including legs and underarms) for at least 48 hours prior to the first CHG shower.  You may shave your face/neck. Please follow these instructions carefully:  1.  Shower with CHG Soap the night before surgery and the  morning of Surgery.  2.  If you choose to wash your hair, wash your hair first as usual with your  normal  shampoo.  3.  After you shampoo, rinse your hair and body thoroughly to remove the  shampoo.                                        4.  Use CHG as you would any other liquid soap.  You can apply chg directly  to the skin and wash , chg soap provided, night before and morning of your surgery.  5.  Apply the CHG Soap to your body ONLY FROM THE NECK DOWN.   Do not use on face/ open                           Wound or open sores. Avoid contact with eyes, ears mouth and genitals (private parts).                       Wash face,  Genitals (private parts) with your normal soap.             6.  Wash thoroughly, paying special attention to the area where your surgery  will be performed.  7.  Thoroughly rinse your body with warm water from the neck down.  8.  DO NOT shower/wash with your normal soap after using and rinsing off  the CHG Soap.             9.  Pat yourself dry with a clean towel.            10.  Wear clean pajamas.            11.  Place clean sheets on your bed the night of your first  shower and do not  sleep with pets. Day of Surgery : Do not apply any lotions/ powders the morning of surgery.  Please wear clean clothes to the hospital/surgery center.  IF YOU HAVE ANY SKIN IRRITATION OR PROBLEMS WITH THE SURGICAL SOAP, PLEASE GET A BAR OF GOLD DIAL SOAP AND SHOWER THE NIGHT BEFORE YOUR SURGERY AND THE MORNING OF YOUR SURGERY. PLEASE LET THE NURSE KNOW MORNING OF YOUR SURGERY IF YOU HAD ANY PROBLEMS WITH THE SURGICAL SOAP.   YOUR SURGEON MAY HAVE REQUESTED EXTENDED RECOVERY TIME AFTER YOUR SURGERY. IT COULD BE A  JUST A FEW HOURS  UP TO AN OVERNIGHT STAY.  YOUR SURGEON SHOULD HAVE DISCUSSED  THIS WITH YOU PRIOR TO YOUR SURGERY. IN THE EVENT YOU NEED TO STAY OVERNIGHT PLEASE REFER TO THE FOLLOWING GUIDELINES. YOU MAY HAVE UP TO 4 VISITORS  MAY VISIT IN THE EXTENDED RECOVERY ROOM UNTIL 800 PM ONLY.  ONE  VISITOR AGE 23 AND OVER MAY SPEND THE NIGHT AND MUST BE IN EXTENDED RECOVERY ROOM NO LATER THAN 800 PM . YOUR DISCHARGE TIME AFTER YOU SPEND THE NIGHT IS 900 AM THE MORNING AFTER YOUR SURGERY. YOU MAY PACK A SMALL OVERNIGHT BAG WITH TOILETRIES FOR YOUR OVERNIGHT STAY IF YOU WISH.  REGARDLESS OF IF YOU STAY OVER NIGHT OR ARE DISCHARGED THE SAME DAY YOU WILL BE REQUIRED TO HAVE A RESPONSIBLE ADULT (18 YRS OLD OR OLDER) STAY WITH YOU FOR AT LEAST THE FIRST 24 HOURS  YOUR PRESCRIPTION MEDICATIONS WILL BE PROVIDED DURING YOUR HOSPITAL STAY.  ________________________________________________________________________                                                        QUESTIONS Kathy Hickman PRE OP NURSE PHONE [phone].

## 2023-04-23 NOTE — Progress Notes (Signed)
Requested a faxed copy of health history,last office visit, labs etc. From [facility] on 04/23/23. I spoke with Dondra Spry at [phone].

## 2023-04-28 ENCOUNTER — Encounter: Payer: Self-pay | Admitting: Hematology and Oncology

## 2023-05-02 ENCOUNTER — Encounter (HOSPITAL_COMMUNITY)
Admission: RE | Admit: 2023-05-02 | Discharge: 2023-05-02 | Disposition: A | Discharge status: Home / Self Care | Payer: BC Managed Care – PPO | Source: Ambulatory Visit | Attending: Obstetrics and Gynecology | Admitting: Obstetrics and Gynecology

## 2023-05-02 DIAGNOSIS — Z01818 Encounter for other preprocedural examination: Secondary | ICD-10-CM | POA: Diagnosis not present

## 2023-05-02 LAB — CBC
HCT: 39.3 % (ref 36.0–46.0)
Hemoglobin: 12.5 g/dL (ref 12.0–15.0)
MCH: 28.2 pg (ref 26.0–34.0)
MCHC: 31.8 g/dL (ref 30.0–36.0)
MCV: 88.5 fL (ref 80.0–100.0)
Platelets: 251 10*3/uL (ref 150–400)
RBC: 4.44 MIL/uL (ref 3.87–5.11)
RDW: 13.7 % (ref 11.5–15.5)
WBC: 4.8 10*3/uL (ref 4.0–10.5)
nRBC: 0 % (ref 0.0–0.2)

## 2023-05-02 LAB — BASIC METABOLIC PANEL
Anion gap: 7 (ref 5–15)
BUN: 16 mg/dL (ref 6–20)
CO2: 23 mmol/L (ref 22–32)
Calcium: 8.5 mg/dL — ABNORMAL LOW (ref 8.9–10.3)
Chloride: 106 mmol/L (ref 98–111)
Creatinine, Ser: 0.79 mg/dL (ref 0.44–1.00)
GFR, Estimated: >60 mL/min (ref >60)
Glucose, Bld: 117 mg/dL — ABNORMAL HIGH (ref 70–99)
Potassium: 3.9 mmol/L (ref 3.5–5.1)
Sodium: 136 mmol/L (ref 135–145)

## 2023-05-06 NOTE — H&P (Signed)
Kathy Hickman is an 42 y.o. female presenting for scheduled surgery with total laparoscopic hysterectomy with bilateral salpingo-oophorectomy for AUB and history of breast cancer.  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 Patient underwent lumpectomy followed by radiation and started on Tamoxifen in February 2023 Initially stopped having cycles for first 6 months but then returned very heavy with clotting  Initial management of AUB 12/09/22 Office Hysteroscopy, D&C, polypectomy for AUB-P on Tamoxifen  -Uncomplicated procedure, significant polyp burden  -Pathology: BENIGN polyps  Patient returned to office 3 months later with return of heavy ongoing bleeding with resumption of Tamoxifen Pelvic US repeated 03/25/23 showing -retroflexed uterus 9.6x6.3x4.5cm thickened ES 21.2mm with inc blood flow suspect polyp, limited left ov view d/t bowel, rt ov WNL 2.3cm follicular cyst, no FF -Repeat EMBx performed: benign disordered proliferative phase endometrium, no hyerplasia or malignancy No desire for future fertility Desires definitive surgical management with hysterectomy and removal of ovaries due to +ER breast cancer history and concerns for ovarian cancer risk in the future  Hx of c/s x1, no other abdominal surgeries Chronic pain patient, seen by Dr. Manon Hickman At [facility]  Negative Genetic Testing done after breast cancer dx, results reviewed on Epic [employer] [facility] gene panel negative including negative BRCA1/2  Last Pap 03/2021 WNL  Menstrual History:  Patient's last menstrual period was 04/20/2023 (exact date).    Past Medical History:  Diagnosis Date   Anemia    Follows w/ PCP.   Anxiety    Follows w/ PCP.   Arthritis    osteoarthritis   Asthma    as a child   Breast cancer (HCC) 2022   right breast ductal carcinoma, s/p lumpectomy and radiation  / Follows with [facility], Dr. Al Hickman.    Carpal tunnel syndrome    s/p bilateral surgery   Complication of anesthesia    Patient states that during an emergency C-section around 2001 she stopped breathing and was shocked twice.  She has not had any problems since, other than waking up during a carpal tunnel surgery. She states that it takes a lot of anesthesia to know her out.   Depression    Follows w/ PCP. Patient states mostly just bad anxiety.   Fibromyalgia    Follows w/ [facility], Dr. Manon Hickman.   Headache    occasional   Hypertension    Follows w/ PCP, Dr. Ivery Hickman.   Hypothyroidism    Dr. Ivery Hickman @ [facility], PCP.   Pain, chronic    due to fibromyalgia, osteoarthritis and degenerative disease in her back.   Pneumonia 2012   community-acquired, Pt states that she used to get pneumonia every year until she started taker colloidal silver drops.    Past Surgical History:  Procedure Laterality Date   BREAST LUMPECTOMY WITH RADIOACTIVE SEED LOCALIZATION Right 05/10/2021   Procedure: RIGHT BREAST LUMPECTOMY WITH RADIOACTIVE SEED LOCALIZATION;  Surgeon: Kathy Bouillon, MD;  Location:  SURGERY CENTER;  Service: General;  Laterality: Right;   CARPAL TUNNEL RELEASE Bilateral 2018   5/30 & 01/16/2017   CESAREAN SECTION  08/05/2000   Dr. Gaynell Hickman    Family History  Problem Relation Age of Onset   Anxiety disorder Mother    Breast cancer Paternal Aunt 49   Colon cancer Paternal Aunt 49   Pancreatic cancer Maternal Grandmother 42   Lung cancer Paternal Grandfather  dx. 41s, smoked   Asthma Son    Allergies Son     Social History:  reports that she has never smoked. She has been exposed to tobacco smoke. She has never used smokeless tobacco. She reports that she does not drink alcohol and does not use drugs.  Allergies:  Allergies  Allergen Reactions   Amoxicillin Hives, Shortness Of Breath and Swelling   Sulfonamide Derivatives Hives, Shortness Of Breath and  Swelling   Latex Rash    No medications prior to admission.    Review of Systems  Height 5\' 4"  (1.626 m), weight 114.3 kg, last menstrual period 04/20/2023. Physical Exam  No results found for this or any previous visit (from the past 24 hour(s)).  No results found.  Assessment/Plan:  42Y P1 with AUB and history of +ER breast cancer consented for Ra-TLH with BSO  Patient has been thoroughly counseled both in the office and again in the preoperative holding unit regarding risks, benefits, and alternatives to surgery with TLH/BSO. Patient counseled on surgical risks including but not limited to excessive bleeding, infection, damage to surrounding structures, VTE/DVT, and inherit risks of anesthesia. She verbalizes good understanding of these risks and agrees. Requests for bilateral oophorectomy as risk reduction for history of +ER breast cancer. Patient understands removal of ovaries places her in menopause and that she is not a candidate for HRT due to cancer history. Declines less invasive procedure with repeat D&C. She has been consented for blood transfusion if clinically indicated. Patient consents signed at bedside.  -Admit to OR -NPO -SCD VTE ppx -UPT*** -Ancef abx ppx -Routine intra-op care  Kathy Hickman A Kathy Hickman 05/06/2023, 10:07 PM

## 2023-05-06 NOTE — Anesthesia Preprocedure Evaluation (Signed)
Anesthesia Evaluation  Patient identified by MRN, date of birth, ID band Patient awake    Reviewed: Allergy & Precautions, NPO status , Patient's Chart, lab work & pertinent test results  History of Anesthesia Complications (+) history of anesthetic complications (reports needing to be shocked during an emergency c-section)  Airway Mallampati: I   Neck ROM: Full    Dental  (+) Dental Advisory Given,    Pulmonary neg shortness of breath, asthma (childhood) , neg sleep apnea, neg COPD, neg recent URI   Pulmonary exam normal breath sounds clear to auscultation       Cardiovascular hypertension, (-) angina (-) Past MI, (-) Cardiac Stents and (-) CABG (-) dysrhythmias  Rhythm:Regular Rate:Normal     Neuro/Psych  Headaches, neg Seizures PSYCHIATRIC DISORDERS Anxiety Depression Bipolar Disorder   Chronic pain - takes oxycodone 10 mg six times a day and morphine 15 mg BID. She took both this morning. Pain is 6/10.    GI/Hepatic Neg liver ROS,GERD  ,,  Endo/Other  Hypothyroidism  Morbid obesity  Renal/GU negative Renal ROS     Musculoskeletal  (+) Arthritis ,  Fibromyalgia -, narcotic dependent  Abdominal  (+) + obese  Peds  Hematology  (+) Blood dyscrasia, anemia Lab Results      Component                Value               Date                      WBC                      4.8                 05/02/2023                HGB                      12.5                05/02/2023                HCT                      39.3                05/02/2023                MCV                      88.5                05/02/2023                PLT                      251                 05/02/2023              Anesthesia Other Findings   Reproductive/Obstetrics H/o breast cancer 2022                             Anesthesia Physical Anesthesia Plan  ASA: 3  Anesthesia Plan: General   Post-op Pain  Management: Tylenol PO (  pre-op)*   Induction: Intravenous  PONV Risk Score and Plan: 3 and Ondansetron, Dexamethasone and Treatment may vary due to age or medical condition  Airway Management Planned: Oral ETT  Additional Equipment:   Intra-op Plan:   Post-operative Plan: Extubation in OR  Informed Consent: I have reviewed the patients History and Physical, chart, labs and discussed the procedure including the risks, benefits and alternatives for the proposed anesthesia with the patient or authorized representative who has indicated his/her understanding and acceptance.     Dental advisory given  Plan Discussed with: CRNA and Anesthesiologist  Anesthesia Plan Comments: (Risks of general anesthesia discussed including, but not limited to, sore throat, hoarse voice, chipped/damaged teeth, injury to vocal cords, nausea and vomiting, allergic reactions, lung infection, heart attack, stroke, and death. All questions answered. )       Anesthesia Quick Evaluation

## 2023-05-07 ENCOUNTER — Ambulatory Visit (HOSPITAL_BASED_OUTPATIENT_CLINIC_OR_DEPARTMENT_OTHER): Payer: BC Managed Care – PPO | Admitting: Anesthesiology

## 2023-05-07 ENCOUNTER — Other Ambulatory Visit: Payer: Self-pay

## 2023-05-07 ENCOUNTER — Ambulatory Visit (HOSPITAL_BASED_OUTPATIENT_CLINIC_OR_DEPARTMENT_OTHER)
Admission: RE | Admit: 2023-05-07 | Discharge: 2023-05-07 | Disposition: A | Discharge status: Home / Self Care | Payer: BC Managed Care – PPO | Attending: Obstetrics and Gynecology | Admitting: Obstetrics and Gynecology

## 2023-05-07 ENCOUNTER — Encounter (HOSPITAL_BASED_OUTPATIENT_CLINIC_OR_DEPARTMENT_OTHER): Payer: Self-pay | Admitting: Obstetrics and Gynecology

## 2023-05-07 ENCOUNTER — Encounter (HOSPITAL_BASED_OUTPATIENT_CLINIC_OR_DEPARTMENT_OTHER)
Admission: RE | Disposition: A | Discharge status: Home / Self Care | Payer: Self-pay | Source: Home / Self Care | Attending: Obstetrics and Gynecology

## 2023-05-07 DIAGNOSIS — E039 Hypothyroidism, unspecified: Secondary | ICD-10-CM | POA: Insufficient documentation

## 2023-05-07 DIAGNOSIS — Z853 Personal history of malignant neoplasm of breast: Secondary | ICD-10-CM | POA: Diagnosis not present

## 2023-05-07 DIAGNOSIS — N83202 Unspecified ovarian cyst, left side: Secondary | ICD-10-CM | POA: Diagnosis not present

## 2023-05-07 DIAGNOSIS — N888 Other specified noninflammatory disorders of cervix uteri: Secondary | ICD-10-CM | POA: Insufficient documentation

## 2023-05-07 DIAGNOSIS — M797 Fibromyalgia: Secondary | ICD-10-CM | POA: Insufficient documentation

## 2023-05-07 DIAGNOSIS — Z01818 Encounter for other preprocedural examination: Secondary | ICD-10-CM

## 2023-05-07 DIAGNOSIS — N809 Endometriosis, unspecified: Secondary | ICD-10-CM | POA: Insufficient documentation

## 2023-05-07 DIAGNOSIS — Z923 Personal history of irradiation: Secondary | ICD-10-CM | POA: Insufficient documentation

## 2023-05-07 DIAGNOSIS — N939 Abnormal uterine and vaginal bleeding, unspecified: Secondary | ICD-10-CM | POA: Diagnosis present

## 2023-05-07 DIAGNOSIS — J45909 Unspecified asthma, uncomplicated: Secondary | ICD-10-CM | POA: Insufficient documentation

## 2023-05-07 DIAGNOSIS — I10 Essential (primary) hypertension: Secondary | ICD-10-CM | POA: Insufficient documentation

## 2023-05-07 DIAGNOSIS — N840 Polyp of corpus uteri: Secondary | ICD-10-CM | POA: Insufficient documentation

## 2023-05-07 DIAGNOSIS — N83201 Unspecified ovarian cyst, right side: Secondary | ICD-10-CM | POA: Insufficient documentation

## 2023-05-07 HISTORY — PX: ROBOTIC ASSISTED TOTAL HYSTERECTOMY WITH BILATERAL SALPINGO OOPHERECTOMY: SHX6086

## 2023-05-07 HISTORY — DX: Headache, unspecified: R51.9

## 2023-05-07 HISTORY — DX: Hypothyroidism, unspecified: E03.9

## 2023-05-07 HISTORY — DX: Anemia, unspecified: D64.9

## 2023-05-07 HISTORY — DX: Other complications of anesthesia, initial encounter: T88.59XA

## 2023-05-07 LAB — TYPE AND SCREEN
ABO/RH(D): A NEG
Antibody Screen: NEGATIVE

## 2023-05-07 LAB — POCT PREGNANCY, URINE: Preg Test, Ur: NEGATIVE

## 2023-05-07 SURGERY — HYSTERECTOMY, TOTAL, ROBOT-ASSISTED, LAPAROSCOPIC, WITH BILATERAL SALPINGO-OOPHORECTOMY
Anesthesia: General | Site: Pelvis | Laterality: Bilateral

## 2023-05-07 MED ORDER — ALPRAZOLAM 0.5 MG PO TABS
ORAL_TABLET | ORAL | Status: AC
  Filled 2023-05-07: qty 1

## 2023-05-07 MED ORDER — ROCURONIUM BROMIDE 100 MG/10ML IV SOLN
INTRAVENOUS | Status: DC | PRN
  Administered 2023-05-07: 70 mg via INTRAVENOUS
  Administered 2023-05-07: 10 mg via INTRAVENOUS
  Administered 2023-05-07: 20 mg via INTRAVENOUS
  Administered 2023-05-07: 10 mg via INTRAVENOUS

## 2023-05-07 MED ORDER — HEMOSTATIC AGENTS (NO CHARGE) OPTIME
TOPICAL | Status: DC | PRN
Start: 2023-05-07 — End: 2023-05-07
  Administered 2023-05-07: 1

## 2023-05-07 MED ORDER — PANTOPRAZOLE SODIUM 40 MG PO TBEC
40.0000 mg | DELAYED_RELEASE_TABLET | Freq: Every day | ORAL | Status: DC
  Administered 2023-05-07: 40 mg via ORAL

## 2023-05-07 MED ORDER — DEXAMETHASONE SODIUM PHOSPHATE 4 MG/ML IJ SOLN
INTRAMUSCULAR | Status: DC | PRN
  Administered 2023-05-07: 5 mg via INTRAVENOUS

## 2023-05-07 MED ORDER — ONDANSETRON HCL 4 MG/2ML IJ SOLN: 4.0000 mg | Freq: Four times a day (QID) | INTRAMUSCULAR | Status: DC | PRN

## 2023-05-07 MED ORDER — FENTANYL CITRATE (PF) 100 MCG/2ML IJ SOLN
INTRAMUSCULAR | Status: AC
  Filled 2023-05-07: qty 2

## 2023-05-07 MED ORDER — DEXMEDETOMIDINE HCL IN NACL 80 MCG/20ML IV SOLN
INTRAVENOUS | Status: DC | PRN
  Administered 2023-05-07: 8 ug via INTRAVENOUS
  Administered 2023-05-07 (×2): 4 ug via INTRAVENOUS

## 2023-05-07 MED ORDER — PROPOFOL 10 MG/ML IV BOLUS
INTRAVENOUS | Status: AC
  Filled 2023-05-07: qty 20

## 2023-05-07 MED ORDER — SCOPOLAMINE 1 MG/3DAYS TD PT72
1.0000 | MEDICATED_PATCH | TRANSDERMAL | Status: DC
  Administered 2023-05-07: 1.5 mg via TRANSDERMAL

## 2023-05-07 MED ORDER — 0.9 % SODIUM CHLORIDE (POUR BTL) OPTIME
TOPICAL | Status: DC | PRN
Start: 2023-05-07 — End: 2023-05-07
  Administered 2023-05-07: 500 mL

## 2023-05-07 MED ORDER — CLINDAMYCIN PHOSPHATE 900 MG/50ML IV SOLN
900.0000 mg | INTRAVENOUS | Status: AC
  Administered 2023-05-07: 900 mg via INTRAVENOUS

## 2023-05-07 MED ORDER — POLYETHYLENE GLYCOL 3350 17 G PO PACK: 17.0000 g | PACK | Freq: Every day | ORAL | Status: DC | PRN

## 2023-05-07 MED ORDER — SCOPOLAMINE 1 MG/3DAYS TD PT72
MEDICATED_PATCH | TRANSDERMAL | Status: AC
  Filled 2023-05-07: qty 1

## 2023-05-07 MED ORDER — HYDROMORPHONE HCL 1 MG/ML IJ SOLN: 0.2000 mg | INTRAMUSCULAR | Status: DC | PRN

## 2023-05-07 MED ORDER — LACTATED RINGERS IV SOLN: INTRAVENOUS | Status: DC

## 2023-05-07 MED ORDER — MENTHOL 3 MG MT LOZG: 1.0000 | LOZENGE | OROMUCOSAL | Status: DC | PRN

## 2023-05-07 MED ORDER — OXYCODONE HCL 5 MG PO TABS
ORAL_TABLET | ORAL | Status: AC
  Filled 2023-05-07: qty 2

## 2023-05-07 MED ORDER — SIMETHICONE 80 MG PO CHEW: 80.0000 mg | CHEWABLE_TABLET | Freq: Four times a day (QID) | ORAL | Status: DC | PRN

## 2023-05-07 MED ORDER — KETAMINE HCL 10 MG/ML IJ SOLN
INTRAMUSCULAR | Status: DC | PRN
  Administered 2023-05-07 (×5): 10 mg via INTRAVENOUS

## 2023-05-07 MED ORDER — LIDOCAINE HCL (PF) 2 % IJ SOLN
INTRAMUSCULAR | Status: AC
  Filled 2023-05-07: qty 5

## 2023-05-07 MED ORDER — SODIUM CHLORIDE 0.9 % IR SOLN
Status: DC | PRN
Start: 2023-05-07 — End: 2023-05-07
  Administered 2023-05-07: 1000 mL

## 2023-05-07 MED ORDER — FENTANYL CITRATE (PF) 250 MCG/5ML IJ SOLN
INTRAMUSCULAR | Status: AC
  Filled 2023-05-07: qty 5

## 2023-05-07 MED ORDER — ACETAMINOPHEN 500 MG PO TABS
ORAL_TABLET | ORAL | Status: AC
  Filled 2023-05-07: qty 2

## 2023-05-07 MED ORDER — ONDANSETRON HCL 4 MG PO TABS: 4.0000 mg | ORAL_TABLET | Freq: Four times a day (QID) | ORAL | Status: DC | PRN

## 2023-05-07 MED ORDER — ONDANSETRON HCL 4 MG/2ML IJ SOLN
INTRAMUSCULAR | Status: DC | PRN
  Administered 2023-05-07: 4 mg via INTRAVENOUS

## 2023-05-07 MED ORDER — HYDROMORPHONE HCL 1 MG/ML IJ SOLN
0.2500 mg | INTRAMUSCULAR | Status: DC | PRN
  Administered 2023-05-07 (×2): 0.5 mg via INTRAVENOUS

## 2023-05-07 MED ORDER — CLINDAMYCIN PHOSPHATE 900 MG/50ML IV SOLN
INTRAVENOUS | Status: AC
  Filled 2023-05-07: qty 50

## 2023-05-07 MED ORDER — LIDOCAINE HCL (CARDIAC) PF 100 MG/5ML IV SOSY
PREFILLED_SYRINGE | INTRAVENOUS | Status: DC | PRN
  Administered 2023-05-07: 100 mg via INTRAVENOUS

## 2023-05-07 MED ORDER — HYDROMORPHONE HCL 1 MG/ML IJ SOLN
INTRAMUSCULAR | Status: AC
  Filled 2023-05-07: qty 1

## 2023-05-07 MED ORDER — MIDAZOLAM HCL 2 MG/2ML IJ SOLN
INTRAMUSCULAR | Status: AC
  Filled 2023-05-07: qty 2

## 2023-05-07 MED ORDER — OXYCODONE HCL 5 MG PO TABS
10.0000 mg | ORAL_TABLET | Freq: Once | ORAL | Status: AC | PRN
  Administered 2023-05-07: 10 mg via ORAL

## 2023-05-07 MED ORDER — OXYCODONE HCL 5 MG PO TABS: 5.0000 mg | ORAL_TABLET | Freq: Once | ORAL | Status: DC | PRN

## 2023-05-07 MED ORDER — HYDROMORPHONE HCL 1 MG/ML IJ SOLN
INTRAMUSCULAR | Status: DC | PRN
Start: 2023-05-07 — End: 2023-05-07
  Administered 2023-05-07: 1 mg via INTRAVENOUS

## 2023-05-07 MED ORDER — FENTANYL CITRATE (PF) 100 MCG/2ML IJ SOLN
25.0000 ug | INTRAMUSCULAR | Status: DC | PRN
  Administered 2023-05-07: 25 ug via INTRAVENOUS
  Administered 2023-05-07: 50 ug via INTRAVENOUS
  Administered 2023-05-07: 25 ug via INTRAVENOUS
  Administered 2023-05-07: 50 ug via INTRAVENOUS

## 2023-05-07 MED ORDER — IBUPROFEN 200 MG PO TABS: 600.0000 mg | ORAL_TABLET | Freq: Four times a day (QID) | ORAL | Status: DC

## 2023-05-07 MED ORDER — KETOROLAC TROMETHAMINE 30 MG/ML IJ SOLN: 30.0000 mg | Freq: Four times a day (QID) | INTRAMUSCULAR | Status: DC

## 2023-05-07 MED ORDER — HYDROMORPHONE HCL 2 MG/ML IJ SOLN
INTRAMUSCULAR | Status: AC
  Filled 2023-05-07: qty 1

## 2023-05-07 MED ORDER — FENTANYL CITRATE (PF) 100 MCG/2ML IJ SOLN
INTRAMUSCULAR | Status: DC | PRN
  Administered 2023-05-07: 50 ug via INTRAVENOUS
  Administered 2023-05-07: 100 ug via INTRAVENOUS
  Administered 2023-05-07 (×2): 50 ug via INTRAVENOUS

## 2023-05-07 MED ORDER — GENTAMICIN SULFATE 40 MG/ML IJ SOLN
5.0000 mg/kg | INTRAVENOUS | Status: AC
  Administered 2023-05-07: 392.4 mg via INTRAVENOUS
  Filled 2023-05-07: qty 9.75

## 2023-05-07 MED ORDER — OXYCODONE HCL 5 MG PO TABS: 5.0000 mg | ORAL_TABLET | ORAL | Status: DC | PRN

## 2023-05-07 MED ORDER — KETAMINE HCL 50 MG/5ML IJ SOSY
PREFILLED_SYRINGE | INTRAMUSCULAR | Status: AC
  Filled 2023-05-07: qty 5

## 2023-05-07 MED ORDER — ALPRAZOLAM 0.5 MG PO TABS
0.5000 mg | ORAL_TABLET | Freq: Once | ORAL | Status: AC
  Administered 2023-05-07: 0.5 mg via ORAL

## 2023-05-07 MED ORDER — BUPIVACAINE HCL (PF) 0.25 % IJ SOLN
INTRAMUSCULAR | Status: DC | PRN
  Administered 2023-05-07: 15 mL

## 2023-05-07 MED ORDER — MIDAZOLAM HCL 5 MG/5ML IJ SOLN
INTRAMUSCULAR | Status: DC | PRN
  Administered 2023-05-07: 2 mg via INTRAVENOUS

## 2023-05-07 MED ORDER — OXYCODONE HCL 5 MG/5ML PO SOLN: 5.0000 mg | Freq: Once | ORAL | Status: DC | PRN

## 2023-05-07 MED ORDER — ROCURONIUM BROMIDE 10 MG/ML (PF) SYRINGE
PREFILLED_SYRINGE | INTRAVENOUS | Status: AC
  Filled 2023-05-07: qty 10

## 2023-05-07 MED ORDER — ONDANSETRON HCL 4 MG/2ML IJ SOLN
INTRAMUSCULAR | Status: AC
  Filled 2023-05-07: qty 2

## 2023-05-07 MED ORDER — POVIDONE-IODINE 10 % EX SWAB: 2.0000 | Freq: Once | CUTANEOUS | Status: DC

## 2023-05-07 MED ORDER — PANTOPRAZOLE SODIUM 40 MG PO TBEC
DELAYED_RELEASE_TABLET | ORAL | Status: AC
  Filled 2023-05-07: qty 1

## 2023-05-07 MED ORDER — KETOROLAC TROMETHAMINE 30 MG/ML IJ SOLN
INTRAMUSCULAR | Status: DC | PRN
  Administered 2023-05-07: 30 mg via INTRAVENOUS

## 2023-05-07 MED ORDER — SUGAMMADEX SODIUM 200 MG/2ML IV SOLN
INTRAVENOUS | Status: DC | PRN
Start: 2023-05-07 — End: 2023-05-07
  Administered 2023-05-07: 200 mg via INTRAVENOUS

## 2023-05-07 MED ORDER — PROPOFOL 10 MG/ML IV BOLUS
INTRAVENOUS | Status: DC | PRN
  Administered 2023-05-07: 200 mg via INTRAVENOUS

## 2023-05-07 MED ORDER — ACETAMINOPHEN 500 MG PO TABS
1000.0000 mg | ORAL_TABLET | Freq: Four times a day (QID) | ORAL | Status: DC
  Administered 2023-05-07: 1000 mg via ORAL

## 2023-05-07 MED ORDER — DEXAMETHASONE SODIUM PHOSPHATE 10 MG/ML IJ SOLN
INTRAMUSCULAR | Status: AC
  Filled 2023-05-07: qty 1

## 2023-05-07 MED ORDER — ACETAMINOPHEN 500 MG PO TABS
1000.0000 mg | ORAL_TABLET | Freq: Once | ORAL | Status: AC
  Administered 2023-05-07: 1000 mg via ORAL

## 2023-05-07 SURGICAL SUPPLY — 66 items
ADH SKN CLS APL DERMABOND .7 (GAUZE/BANDAGES/DRESSINGS) ×1
APL PRP STRL LF DISP 70% ISPRP (MISCELLANEOUS) ×1
APL SRG 38 LTWT LNG FL B (MISCELLANEOUS) ×1
APPLICATOR ARISTA FLEXITIP XL (MISCELLANEOUS) IMPLANT
BARRIER ADHS 3X4 INTERCEED (GAUZE/BANDAGES/DRESSINGS) IMPLANT
BRR ADH 4X3 ABS CNTRL BYND (GAUZE/BANDAGES/DRESSINGS)
CHLORAPREP W/TINT 26 (MISCELLANEOUS) ×1 IMPLANT
COVER BACK TABLE 60X90IN (DRAPES) ×1 IMPLANT
COVER SURGICAL LIGHT HANDLE (MISCELLANEOUS) IMPLANT
COVER TIP SHEARS 8 DVNC (MISCELLANEOUS) ×1 IMPLANT
DEFOGGER SCOPE WARMER CLEARIFY (MISCELLANEOUS) ×1 IMPLANT
DERMABOND ADVANCED .7 DNX12 (GAUZE/BANDAGES/DRESSINGS) ×1 IMPLANT
DRAPE ARM DVNC X/XI (DISPOSABLE) ×4 IMPLANT
DRAPE COLUMN DVNC XI (DISPOSABLE) ×1 IMPLANT
DRAPE SURG IRRIG POUCH 19X23 (DRAPES) ×1 IMPLANT
DRAPE UTILITY XL STRL (DRAPES) ×1 IMPLANT
DRIVER NDL MEGA SUTCUT DVNCXI (INSTRUMENTS) ×1 IMPLANT
DRIVER NDLE MEGA SUTCUT DVNCXI (INSTRUMENTS) ×1
ELECT REM PT RETURN 9FT ADLT (ELECTROSURGICAL) ×1
ELECTRODE REM PT RTRN 9FT ADLT (ELECTROSURGICAL) ×1 IMPLANT
FORCEPS BPLR FENES DVNC XI (FORCEP) ×1 IMPLANT
FORCEPS LONG TIP 8 DVNC XI (FORCEP) ×1 IMPLANT
FORCEPS PROGRASP DVNC XI (FORCEP) ×1 IMPLANT
GAUZE 4X4 16PLY ~~LOC~~+RFID DBL (SPONGE) IMPLANT
GAUZE VASELINE 3X9 (GAUZE/BANDAGES/DRESSINGS) IMPLANT
GLOVE BIOGEL PI IND STRL 7.0 (GLOVE) ×3 IMPLANT
GLOVE SURG SS PI 6.5 STRL IVOR (GLOVE) IMPLANT
GOWN STRL REUS W/ TWL LRG LVL3 (GOWN DISPOSABLE) IMPLANT
GOWN STRL REUS W/TWL LRG LVL3 (GOWN DISPOSABLE) ×5
HEMOSTAT ARISTA ABSORB 3G PWDR (HEMOSTASIS) IMPLANT
HOLDER FOLEY CATH W/STRAP (MISCELLANEOUS) IMPLANT
IRRIG SUCT STRYKERFLOW 2 WTIP (MISCELLANEOUS) ×1
IRRIGATION SUCT STRKRFLW 2 WTP (MISCELLANEOUS) ×1 IMPLANT
IV NS 1000ML (IV SOLUTION) ×1
IV NS 1000ML BAXH (IV SOLUTION) ×1 IMPLANT
KIT PINK PAD W/HEAD ARE REST (MISCELLANEOUS) ×1
KIT PINK PAD W/HEAD ARM REST (MISCELLANEOUS) ×1 IMPLANT
KIT TURNOVER CYSTO (KITS) ×1 IMPLANT
LEGGING LITHOTOMY PAIR STRL (DRAPES) ×1 IMPLANT
MANIPULATOR VCARE LG CRV RETR (MISCELLANEOUS) IMPLANT
MANIPULATOR VCARE SML CRV RETR (MISCELLANEOUS) IMPLANT
MANIPULATOR VCARE STD CRV RETR (MISCELLANEOUS) IMPLANT
NDL INSUFFLATION 14GA 120MM (NEEDLE) ×1 IMPLANT
NEEDLE INSUFFLATION 14GA 120MM (NEEDLE) ×1
NS IRRIG 500ML POUR BTL (IV SOLUTION) IMPLANT
OBTURATOR OPTICAL STND 8 DVNC (TROCAR) ×1
OBTURATOR OPTICALSTD 8 DVNC (TROCAR) ×1 IMPLANT
OCCLUDER COLPOPNEUMO (BALLOONS) ×1 IMPLANT
PACK ROBOT WH (CUSTOM PROCEDURE TRAY) ×1 IMPLANT
PACK ROBOTIC GOWN (GOWN DISPOSABLE) ×1 IMPLANT
PAD OB MATERNITY 4.3X12.25 (PERSONAL CARE ITEMS) ×1 IMPLANT
PAD PREP 24X48 CUFFED NSTRL (MISCELLANEOUS) ×1 IMPLANT
PROTECTOR NERVE ULNAR (MISCELLANEOUS) ×2 IMPLANT
SCISSORS MNPLR CVD DVNC XI (INSTRUMENTS) ×1 IMPLANT
SEAL UNIV 5-12 XI (MISCELLANEOUS) ×4 IMPLANT
SET TRI-LUMEN FLTR TB AIRSEAL (TUBING) ×1 IMPLANT
SLEEVE SCD COMPRESS KNEE MED (STOCKING) ×1 IMPLANT
SPIKE FLUID TRANSFER (MISCELLANEOUS) ×2 IMPLANT
SUT VIC AB 0 CT1 27 (SUTURE) ×2
SUT VIC AB 0 CT1 27XBRD ANBCTR (SUTURE) ×2 IMPLANT
SUT VIC AB 4-0 PS2 27 (SUTURE) ×2 IMPLANT
SUT VICRYL 0 UR6 27IN ABS (SUTURE) IMPLANT
SUT VLOC 180 0 9IN GS21 (SUTURE) ×1 IMPLANT
TOWEL OR 17X24 6PK STRL BLUE (TOWEL DISPOSABLE) ×1 IMPLANT
TRAY FOLEY W/BAG SLVR 14FR LF (SET/KITS/TRAYS/PACK) ×1 IMPLANT
TROCAR PORT AIRSEAL 5X120 (TROCAR) ×1 IMPLANT

## 2023-05-07 NOTE — Anesthesia Postprocedure Evaluation (Signed)
Anesthesia Post Note  Patient: Kathy Hickman  Procedure(s) Performed: XI ROBOTIC ASSISTED TOTAL HYSTERECTOMY WITH BILATERAL SALPINGO OOPHORECTOMY (Bilateral: Pelvis)     Patient location during evaluation: PACU Anesthesia Type: General Level of consciousness: sedated and patient cooperative Pain management: pain level controlled Vital Signs Assessment: post-procedure vital signs reviewed and stable Respiratory status: spontaneous breathing Cardiovascular status: stable Anesthetic complications: no   No notable events documented.  Last Vitals:  Vitals:   05/07/23 1341 05/07/23 1541  BP: (!) 154/73 (!) 154/90  Pulse: 80 66  Resp:  15  Temp:  36.6 C  SpO2: 94% 93%    Last Pain:  Vitals:   05/07/23 1614  TempSrc:   PainSc: 5                  Lewie Loron

## 2023-05-07 NOTE — Progress Notes (Signed)
Incentive spirometry sent home with patient.

## 2023-05-07 NOTE — Anesthesia Procedure Notes (Signed)
Procedure Name: Intubation Date/Time: 05/07/2023 8:58 AM  Performed by: Jessica Priest, CRNAPre-anesthesia Checklist: Patient identified, Emergency Drugs available, Suction available, Patient being monitored and Timeout performed Patient Re-evaluated:Patient Re-evaluated prior to induction Oxygen Delivery Method: Circle system utilized Preoxygenation: Pre-oxygenation with 100% oxygen Induction Type: IV induction Ventilation: Mask ventilation without difficulty Laryngoscope Size: Mac and 3 Grade View: Grade II Tube type: Oral Tube size: 7.0 mm Number of attempts: 1 Airway Equipment and Method: Stylet and Oral airway Placement Confirmation: ETT inserted through vocal cords under direct vision, positive ETCO2, breath sounds checked- equal and bilateral and CO2 detector Secured at: 23 cm Tube secured with: Tape Dental Injury: Teeth and Oropharynx as per pre-operative assessment

## 2023-05-07 NOTE — Discharge Instructions (Addendum)
Post Anesthesia Home Care Instructions  Activity: Get plenty of rest for the remainder of the day. A responsible individual must stay with you for 24 hours following the procedure.  For the next 24 hours, DO NOT: -Drive a car -Advertising copywriter -Drink alcoholic beverages -Take any medication unless instructed by your physician -Make any legal decisions or sign important papers.  Meals: Start with liquid foods such as gelatin or soup. Progress to regular foods as tolerated. Avoid greasy, spicy, heavy foods. If nausea and/or vomiting occur, drink only clear liquids until the nausea and/or vomiting subsides. Call your physician if vomiting continues.  Special Instructions/Symptoms: Your throat may feel dry or sore from the anesthesia or the breathing tube placed in your throat during surgery. If this causes discomfort, gargle with warm salt water. The discomfort should disappear within 24 hours.  If you had a scopolamine patch placed behind your ear for the management of post- operative nausea and/or vomiting:  1. The medication in the patch is effective for 72 hours, after which it should be removed.  Wrap patch in a tissue and discard in the trash. Wash hands thoroughly with soap and water. 2. You may remove the patch earlier than 72 hours if you experience unpleasant side effects which may include dry mouth, dizziness or visual disturbances. 3. Avoid touching the patch. Wash your hands with soap and water after contact with the patch.    Post Anesthesia Home Care Instructions  Activity: Get plenty of rest for the remainder of the day. A responsible individual must stay with you for 24 hours following the procedure.  For the next 24 hours, DO NOT: -Drive a car -Advertising copywriter -Drink alcoholic beverages -Take any medication unless instructed by your physician -Make any legal decisions or sign important papers.  Meals: Start with liquid foods such as gelatin or soup. Progress to  regular foods as tolerated. Avoid greasy, spicy, heavy foods. If nausea and/or vomiting occur, drink only clear liquids until the nausea and/or vomiting subsides. Call your physician if vomiting continues.  Special Instructions/Symptoms: Your throat may feel dry or sore from the anesthesia or the breathing tube placed in your throat during surgery. If this causes discomfort, gargle with warm salt water. The discomfort should disappear within 24 hours.  If you had a scopolamine patch placed behind your ear for the management of post- operative nausea and/or vomiting:  1. The medication in the patch is effective for 72 hours, after which it should be removed.  Wrap patch in a tissue and discard in the trash. Wash hands thoroughly with soap and water. 2. You may remove the patch earlier than 72 hours if you experience unpleasant side effects which may include dry mouth, dizziness or visual disturbances. 3. Avoid touching the patch. Wash your hands with soap and water after contact with the patch.    Post Anesthesia Home Care Instructions  Activity: Get plenty of rest for the remainder of the day. A responsible individual must stay with you for 24 hours following the procedure.  For the next 24 hours, DO NOT: -Drive a car -Advertising copywriter -Drink alcoholic beverages -Take any medication unless instructed by your physician -Make any legal decisions or sign important papers.  Meals: Start with liquid foods such as gelatin or soup. Progress to regular foods as tolerated. Avoid greasy, spicy, heavy foods. If nausea and/or vomiting occur, drink only clear liquids until the nausea and/or vomiting subsides. Call your physician if vomiting continues.  Special Instructions/Symptoms: Your  throat may feel dry or sore from the anesthesia or the breathing tube placed in your throat during surgery. If this causes discomfort, gargle with warm salt water. The discomfort should disappear within 24 hours.  If  you had a scopolamine patch placed behind your ear for the management of post- operative nausea and/or vomiting:  1. The medication in the patch is effective for 72 hours, after which it should be removed.  Wrap patch in a tissue and discard in the trash. Wash hands thoroughly with soap and water. 2. You may remove the patch earlier than 72 hours if you experience unpleasant side effects which may include dry mouth, dizziness or visual disturbances. 3. Avoid touching the patch. Wash your hands with soap and water after contact with the patch.    No Tylenol until 8:00 p.m.

## 2023-05-07 NOTE — Transfer of Care (Signed)
Immediate Anesthesia Transfer of Care Note  Patient: Kathy Hickman  Procedure(s) Performed: Procedure(s) (LRB): XI ROBOTIC ASSISTED TOTAL HYSTERECTOMY WITH BILATERAL SALPINGO OOPHORECTOMY (Bilateral)  Patient Location: PACU  Anesthesia Type: General  Level of Consciousness: awake, oriented, sedated and patient cooperative  Airway & Oxygen Therapy: Patient Spontanous Breathing and Patient connected to face mask oxygen  Post-op Assessment: Report given to PACU RN and Post -op Vital signs reviewed and stable  Post vital signs: Reviewed and stable  Complications: No apparent anesthesia complications  Last Vitals:  Vitals Value Taken Time  BP 116/82 05/07/23 1158  Temp    Pulse 92 05/07/23 1200  Resp 13 05/07/23 1159  SpO2 98 % 05/07/23 1200  Vitals shown include unfiled device data.  Last Pain:  Vitals:   05/07/23 0700  TempSrc: Oral  PainSc: 5       Patients Stated Pain Goal: 5 (05/07/23 0700)  Complications: No notable events documented.

## 2023-05-07 NOTE — Op Note (Signed)
Operative Note  Kathy Hickman [id number] [birth year]   PROCEDURE: robotic assisted total laparoscopic hysterectomy, bilateral salpingo-oophorectomy  PRE-OPERATIVE DIAGNOSIS:  Abnormal uterine bleeding History of estrogen receptor positive breast cancer  POST-OPERATIVE DIAGNOSIS:  Abnormal uterine bleeding History of estrogen receptor positive breast cancer Endometriosis  SURGEON: Dr. Clance Boll, DO  ASSISTANT: Dr. Rhoderick Moody, MD  FINDINGS: pelvic exam reveals normal external female genitalia without lesions, normal appearing ectocervix without lesions. Laparoscopic survey reveals grossly normal upper abdominal survey, female pelvic anatomy significant for endometriosis with powder burn lesions noted on pelvic side wall and round ligament with bilateral ovaries densely adherent to the posterior lower uterine segment and uterosacral ligaments, otherwise small normal appearing uterus with bilateral fallopian tubes  SPECIMENS: uterus, cervix, bilateral fallopian tubes, and bilateral ovaries  EBL: 75 cc  FLUIDS: 1600 cc crystalloids  UOP: 400 cc clear  MEDICATIONS: Clindamycin and Gentamicin IV   COMPLICATIONS: None  PROCEDURE IN DETAIL:   After the patient was appropriately consented in the holding area, she was taken to the operating room where general anesthesia was administered without complications. The patient was placed in the dorsal lithotomy position. Bilateral arms were tucked with the appropriate barrier padding. The patient was prepped and draped in the usual sterile fashion. An appropriate time out was performed that verified the correct patient, procedure, and surgical team.   Attention was made to the pelvis first. Kathy sterile speculum was placed inside the vagina and the cervix was visualized. Kathy single tooth tenaculum was used to grasp the anterior lip of the cervix. An 0-Vicryl suture was placed at the anterior portion of the cervix. The uterus was sounded to  9 cm. The cervix was serially dilated to accommodate the uterine manipulator. Kathy size medium V-care was used and the colpotomy ring secured into place. The Foley catheter was then placed into the bladder which was drained of clear yellow urine and remained in place for the duration of the procedure.   Attention was turned to the abdomen where the laparoscopic equipment had been assembled. Local anesthetic using 0.25% plan Marcaine was injected just inferior to the umbilicus. The scalpel was used to make Kathy small vertical skin incision. The abdomen was entered using the Veress needle technique. The Veress needle was advanced through the skin incision until 2 clicks were appreciated. The gas was connected, turned on, and low entry pressure of 3 mmHg was appreciated. Pneumoperitoneum was obtained without complications to Kathy set pressure of 15 mmHg. Kathy 5 mm laparoscope and trocar were used to enter the abdomen under direct visualization. Inspection of the area below revealed no gross damage to the underlying structures. The patient was placed into Trendelenburg positioning to aid in visualization and inspection of the abdomen revealed the findings as noted above. The patient was laid flat and the remaining port sites were placed under laparoscopic visualization, utilizing 1 additional robotic trocar on the left and 2 additional robotic trocars on the right. Kathy 5 mm AirSeal assist port was also placed on the left. The robot was then moved into place and the robotic arms connected to the trocars. Targeting performed and robotic instruments introduced under direct visualization.   Attention was then turned to the robotic console. Starting on the left side. The ureter was visualized at the pelvic rim and safely distant from the IP ligament. The left IP ligament was transected using Bipolar cautery and monopolar scissors.Sequential cautery and cut was made moving medially towards the uterus along the posterior leaf of  the  broad ligament and mesosalpinx to free the fallopian tube. The left round ligament was then desiccated and transected. The anterior leaf of the broad ligament was opened up and dissected off moving inferiorly towards the cervix. The bladder flap was started anteriorly.  Attention was then turned to the right side and dissection performed in Kathy similar fashion. The anterior bladder flap was then further developed until the white cervicovesical fascia was appreciated. The bilateral uterine arteries were skeletonized and desiccated at the level of internal cervical os. The colpotomy was performed circumferentially at the cervicovaginal junction using the monopolar scissors until the green uterine manipulator colpotomy cup was seen. The uterus with attached fallopian tubes was then removed through the vagina. All specimens were sent for final pathology. The vaginal cuff was closed in the usual running fashion using V-loc. Digital inspection of the vaginal cuff confirmed complete closure. Adequate hemostasis of the vaginal cuff was appreciated. Additional hemostatic agent using Arista was applied.   The robotic instruments were all removed and the robot undocked. The trocars were removed under laparoscopic visualization. The AirSeal was turned off and the abdomen allowed to desufflate. The skin incisions were closed with 4-0 Vicryl and skin glue. The patient tolerated the procedure well and was taken to the recovery area in stable condition. All instrument, needle, and lap counts were correct.   Kathy Hickman Kathy Hickman 05/07/23 12:25 PM

## 2023-05-07 NOTE — Progress Notes (Signed)
After patient requested something over her port sites I covered them with Tegaderm.  Patient was instructed by RN to remove in a couple days.  Patient was afraid she would pick at the sites while sleeping.

## 2023-05-08 ENCOUNTER — Encounter (HOSPITAL_BASED_OUTPATIENT_CLINIC_OR_DEPARTMENT_OTHER): Payer: Self-pay | Admitting: Obstetrics and Gynecology

## 2023-05-12 LAB — SURGICAL PATHOLOGY

## 2023-07-26 ENCOUNTER — Other Ambulatory Visit: Payer: Self-pay | Admitting: Hematology and Oncology

## 2023-07-31 NOTE — Telephone Encounter (Signed)
Pls review.

## 2023-08-01 IMAGING — MG MM PLC BREAST LOC DEV 1ST LESION INC*R*
7 series · 7 of 7 positions shown · non-contrast
Comparison: Previous exam(s).

CLINICAL DATA: 40-year-old female for radioactive seed localization
of RIGHT breast cancer prior to lumpectomy.

EXAM:
MAMMOGRAPHIC GUIDED RADIOACTIVE SEED LOCALIZATION OF THE RIGHT
BREAST

[R CC (1 of 3)]
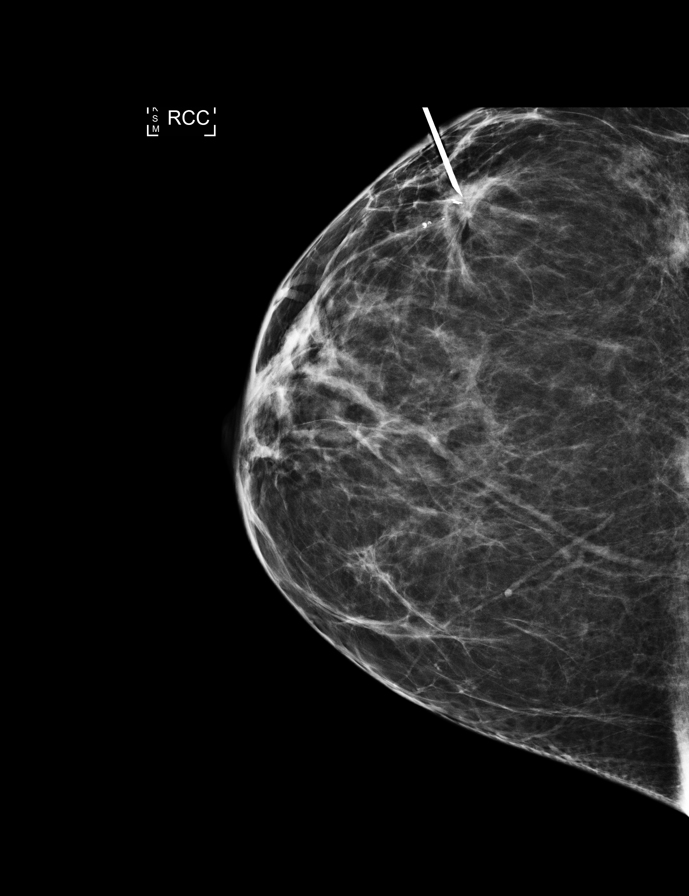

[R LM (1 of 4)]
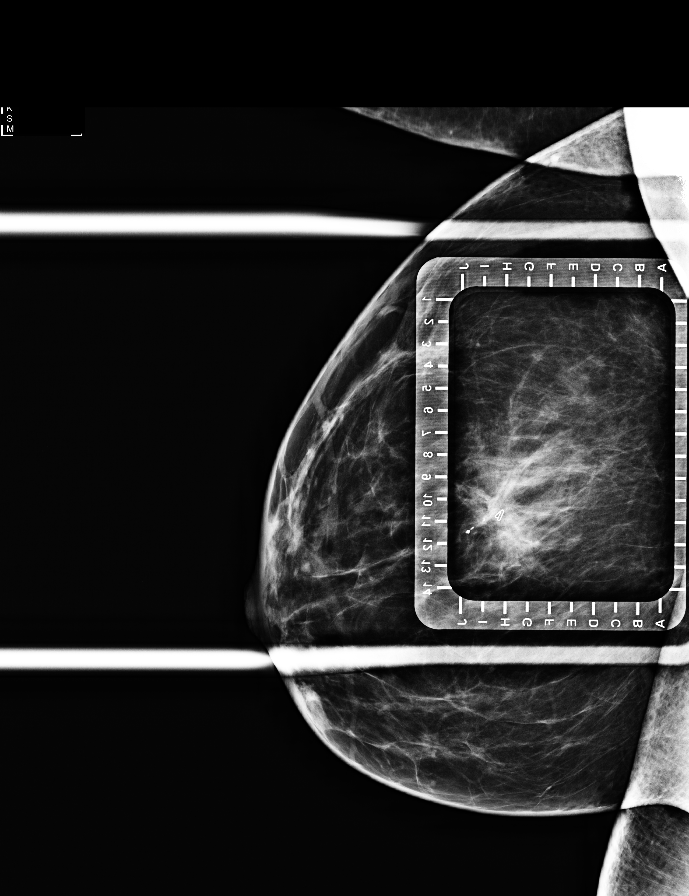

[R CC (2 of 3)]
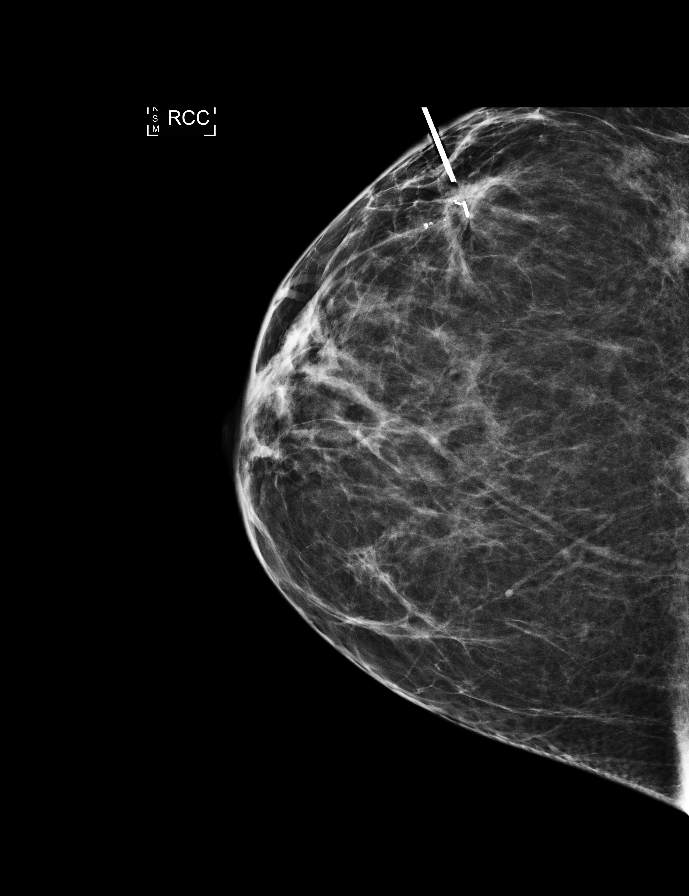

[R LM (2 of 4)]
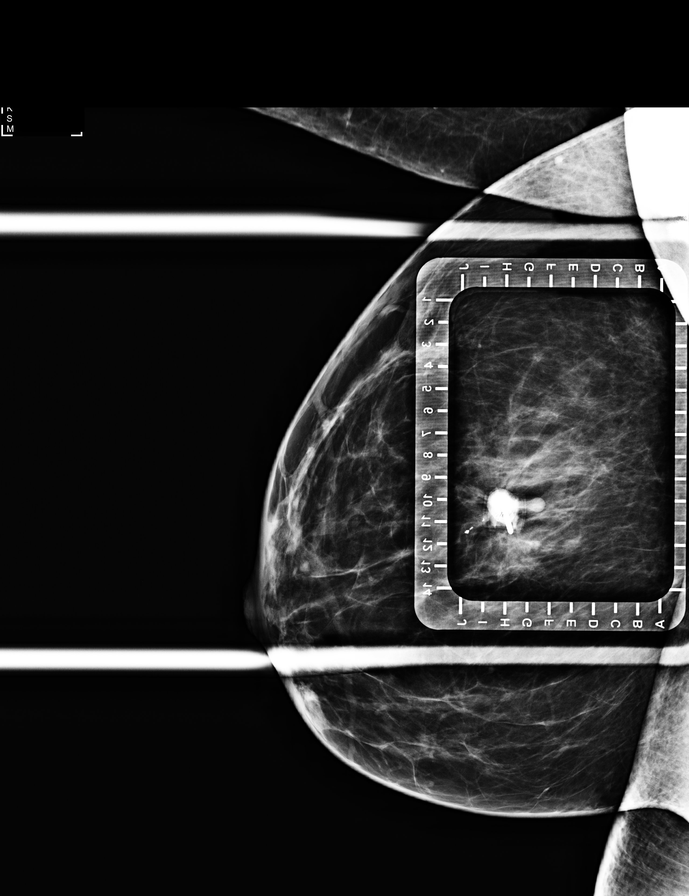

[R CC (3 of 3)]
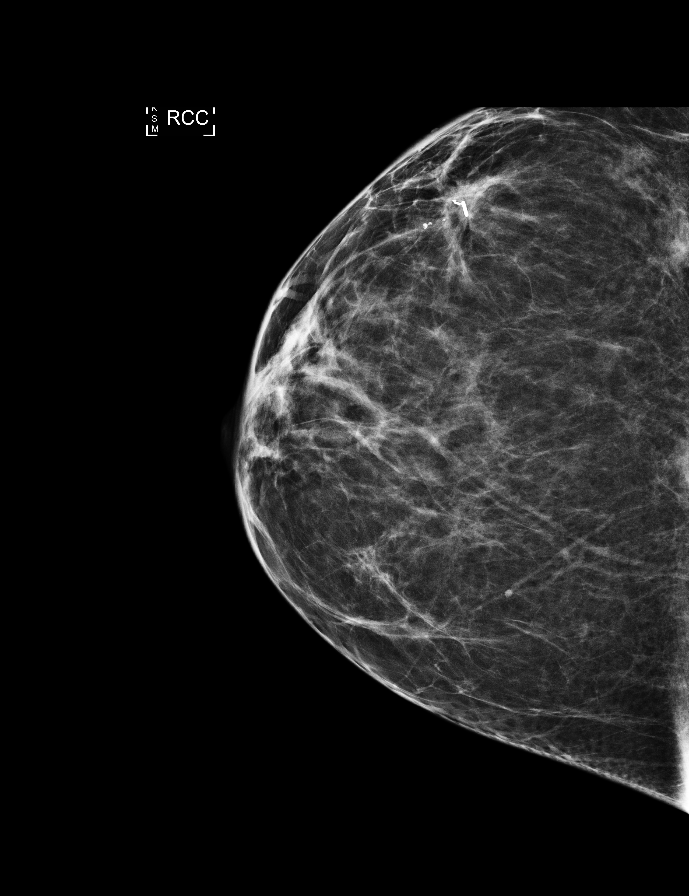

[R LM (3 of 4)]
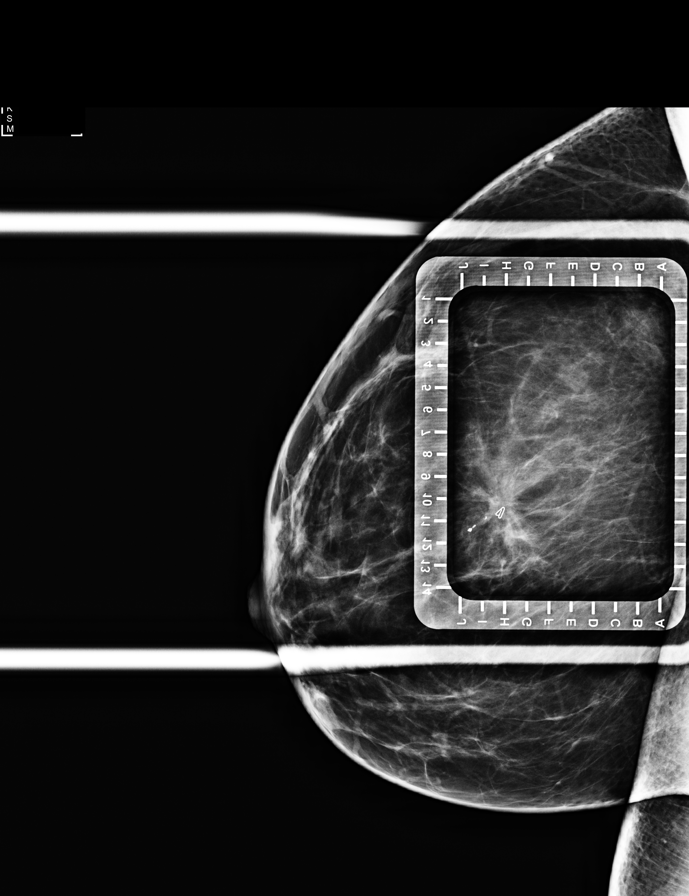

[R LM (4 of 4)]
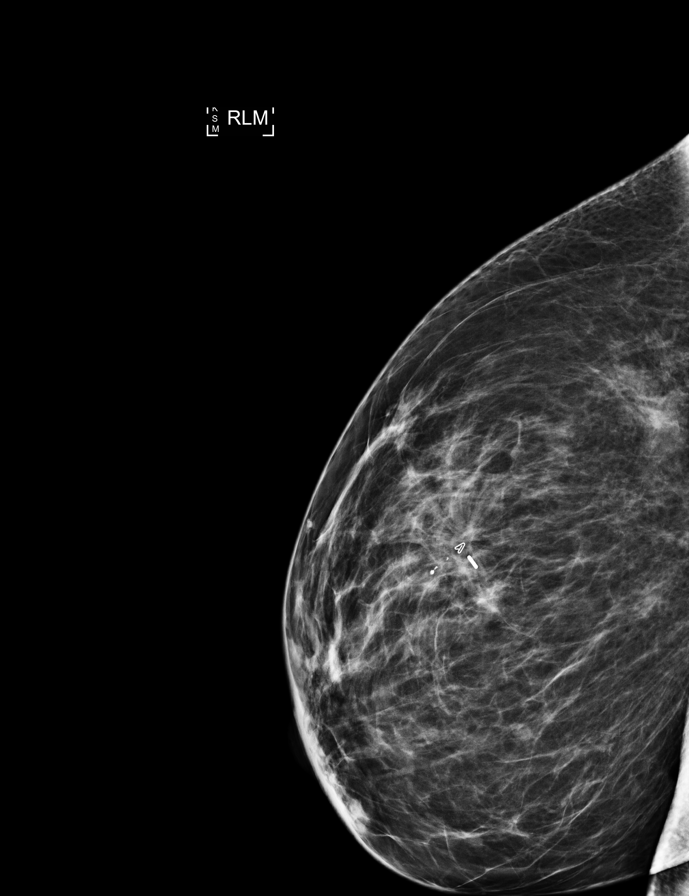

[7 of 7 positions shown; findings below may reference images not displayed]

FINDINGS: Patient presents for radioactive seed localization prior to RIGHT
lumpectomy. I met with the patient and we discussed the procedure of
seed localization including benefits and alternatives. We discussed
the high likelihood of a successful procedure. We discussed the
risks of the procedure including infection, bleeding, tissue injury
and further surgery. We discussed the low dose of radioactivity
involved in the procedure. Informed, written consent was given.

The usual time-out protocol was performed immediately prior to the
procedure.

Using mammographic guidance, sterile technique, 1% lidocaine and an
E-PTW radioactive seed, the heart shaped biopsy clip was localized
using a LATERAL approach. The follow-up mammogram images confirm the
seed in the expected location and were marked for Dr. Leron.

Follow-up survey of the patient confirms presence of the radioactive
seed.

Order number of E-PTW seed:  [id number].

Total activity:  0.239 millicuries.  Reference Date: 04/11/2021.

The patient tolerated the procedure well and was released from the
[REDACTED]. She was given instructions regarding seed removal.
IMPRESSION: Radioactive seed localization RIGHT breast. No apparent
complications.

## 2023-08-26 ENCOUNTER — Other Ambulatory Visit (HOSPITAL_COMMUNITY): Payer: Self-pay

## 2023-08-26 MED ORDER — OXYCODONE HCL 10 MG PO TABS
10.0000 mg | ORAL_TABLET | ORAL | 0 refills | Status: AC | PRN
  Filled 2023-08-26: qty 180, 30d supply, fill #0

## 2023-08-26 MED ORDER — MORPHINE SULFATE ER 15 MG PO TBCR
15.0000 mg | EXTENDED_RELEASE_TABLET | Freq: Three times a day (TID) | ORAL | 0 refills | Status: DC
  Filled 2023-08-26: qty 90, 30d supply, fill #0

## 2023-08-28 ENCOUNTER — Other Ambulatory Visit (HOSPITAL_COMMUNITY): Payer: Self-pay

## 2023-09-05 ENCOUNTER — Inpatient Hospital Stay: Payer: BC Managed Care – PPO | Attending: Hematology and Oncology | Admitting: Hematology and Oncology

## 2023-09-05 VITALS — BP 150/68 | HR 95 | Temp 97.5°F | Resp 16 | Wt 235.7 lb

## 2023-09-05 DIAGNOSIS — N951 Menopausal and female climacteric states: Secondary | ICD-10-CM | POA: Insufficient documentation

## 2023-09-05 DIAGNOSIS — Z923 Personal history of irradiation: Secondary | ICD-10-CM | POA: Diagnosis not present

## 2023-09-05 DIAGNOSIS — Z79811 Long term (current) use of aromatase inhibitors: Secondary | ICD-10-CM | POA: Diagnosis not present

## 2023-09-05 DIAGNOSIS — D0511 Intraductal carcinoma in situ of right breast: Secondary | ICD-10-CM | POA: Diagnosis not present

## 2023-09-05 DIAGNOSIS — Z170 Estrogen receptor positive status [ER+]: Secondary | ICD-10-CM | POA: Diagnosis not present

## 2023-09-05 DIAGNOSIS — Z1721 Progesterone receptor positive status: Secondary | ICD-10-CM | POA: Diagnosis not present

## 2023-09-05 DIAGNOSIS — Z79899 Other long term (current) drug therapy: Secondary | ICD-10-CM | POA: Diagnosis not present

## 2023-09-05 NOTE — Progress Notes (Signed)
[facility]  [phone] [phone]    ID: Kathy Hickman DOB: Aug 06, 1980  MR#: [id number]  XBM#:[id number]  Patient Care Team: Garlan Fillers, MD as PCP - General (Internal Medicine) Harriette Bouillon, MD as Consulting Physician (General Surgery) Dorothy Puffer, MD as Consulting Physician (Radiation Oncology) Toy Baker, DO as Consulting Physician (Obstetrics and Gynecology) Rachel Moulds, MD as Consulting Physician (Hematology and Oncology) Rachel Moulds, MD  CHIEF COMPLAINT: noninvasive breast cancer, estrogen receptor positive   HISTORY OF CURRENT ILLNESS:  "Kathy Hickman" had her first ever screening mammography on 03/21/2021 showing a possible abnormality in the right breast. She underwent right diagnostic mammography with tomography and right breast ultrasonography at [facility] on 04/11/2021 showing: breast density category C; 0.8 cm irregular mass and associated distortion in right breast at 10 o'clock; no axillary lymphadenopathy.  Accordingly on 04/17/2021 she proceeded to biopsy of the right breast area in question. 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 Prognostic indicators significant for: estrogen receptor, 85% positive and progesterone receptor, 10% positive, both with strong staining intensity.    Cancer Staging  Ductal carcinoma in situ (DCIS) of right breast Staging form: Breast, AJCC 8th Edition - Clinical stage from 04/25/2021: Stage 0 (cTis (DCIS), cN0, cM0, ER+, PR+, HER2: Not Assessed) - Unsigned Stage prefix: Initial diagnosis Nuclear grade: G2 Laterality: Right Staged by: Pathologist and managing physician Stage used in treatment planning: Yes National guidelines used in treatment planning: Yes Type of national guideline used in treatment planning: NCCN  Patient underwent right breast seed  localized lumpectomy on 05/10/2021 for right breast ductal carcinoma in situ. Surgical pathology showed residual complex sclerosing lesion with florid usual ductal hyperplasia, no residual ductal carcinoma identified.   She completed adjuvant radiation  She is on antiestrogen therapy with tamoxifen.  INTERVAL HISTORY: Discussed the use of AI scribe software for clinical note transcription with the patient, who gave verbal consent to proceed.  History of Present Illness         The patient presents with mood swings and hot flashes following a total hysterectomy.  She underwent a total hysterectomy on May 11, 2023, due to recurrent uterine polyps. Initially, the polyps were treated with removal in May 2024, but they recurred within two months, causing excessive bleeding. Tamoxifen, which she was taking, might have contributed to the recurrence of polyps due to its effects on the uterus, leading her to opt for a complete hysterectomy.  She experiences significant mood swings and hot flashes, which she attributes to menopause following her hysterectomy. The mood swings are severe, and the hot flashes are nearly unbearable. She has not tried venlafaxine for her symptoms but recalls that oxybutynin, prescribed about a year ago, did not help significantly.  She is currently taking tamoxifen but wishes to discontinue it due to its side effects and her belief that it contributed to her need for a hysterectomy. She is also on Cymbalta, prescribed by her pain management doctor for fibromyalgia, neuropathic pain, and other conditions including nausea, osteoporosis, osteoarthritis, and cervical disc issues. She is unsure if the dose can be increased to help with her mood swings.  Her family history includes bone density issues, with two aunts and a grandmother affected. She is concerned about her bone density, especially since she is aware that certain medications can worsen it. She mentions staying active  and not planning to retire soon as  part of her lifestyle.   COVID 19 VACCINATION STATUS: not vaccinated as of September 2022; has not had COVID   PAST MEDICAL HISTORY: Past Medical History:  Diagnosis Date   Anemia    Follows w/ PCP.   Anxiety    Follows w/ PCP.   Arthritis    osteoarthritis   Asthma    as a child   Breast cancer (HCC) 2022   right breast ductal carcinoma, s/p lumpectomy and radiation  / Follows with [facility], Dr. Al Pimple.   Carpal tunnel syndrome    s/p bilateral surgery   Complication of anesthesia    Patient states that during an emergency C-section around 2001 she stopped breathing and was shocked twice.  She has not had any problems since, other than waking up during a carpal tunnel surgery. She states that it takes a lot of anesthesia to know her out.   Depression    Follows w/ PCP. Patient states mostly just bad anxiety.   Fibromyalgia    Follows w/ [facility], Dr. Manon Hilding.   Headache    occasional   Hypertension    Follows w/ PCP, Dr. Ivery Quale.   Hypothyroidism    Dr. Ivery Quale @ [facility], PCP.   Pain, chronic    due to fibromyalgia, osteoarthritis and degenerative disease in her back.   Pneumonia 2012   community-acquired, Pt states that she used to get pneumonia every year until she started taker colloidal silver drops.    PAST SURGICAL HISTORY: Past Surgical History:  Procedure Laterality Date   BREAST LUMPECTOMY WITH RADIOACTIVE SEED LOCALIZATION Right 05/10/2021   Procedure: RIGHT BREAST LUMPECTOMY WITH RADIOACTIVE SEED LOCALIZATION;  Surgeon: Harriette Bouillon, MD;  Location: [facility];  Service: General;  Laterality: Right;   CARPAL TUNNEL RELEASE Bilateral 2018   5/30 & 01/16/2017   CESAREAN SECTION  08/05/2000   Dr. Gaynell Face   ROBOTIC ASSISTED TOTAL HYSTERECTOMY WITH BILATERAL SALPINGO OOPHERECTOMY Bilateral 05/07/2023   Procedure: XI ROBOTIC ASSISTED TOTAL HYSTERECTOMY WITH BILATERAL  SALPINGO OOPHORECTOMY;  Surgeon: Toy Baker, DO;  Location: [facility];  Service: Gynecology;  Laterality: Bilateral;    FAMILY HISTORY: Family History  Problem Relation Age of Onset   Anxiety disorder Mother    Breast cancer Paternal Aunt 66   Colon cancer Paternal Aunt 28   Pancreatic cancer Maternal Grandmother 3   Lung cancer Paternal Grandfather        dx. 52s, smoked   Asthma Son    Allergies Son    Her father died at age 68 from alcohol, drug issues. Her mother is currently 37 years old (as of 04/2021). Kathy Hickman has one brother (and no sisters). She reports lung cancer in her paternal grandfather in his 5's, breast cancer in a paternal aunt at age 67, colon cancer in a different paternal aunt at age 12, and pancreatic cancer in her maternal grandmother at age 77.   GYNECOLOGIC HISTORY:  No LMP recorded. Menarche: 43 years old Age at first live birth: 43 years old GX P 1 LMP 04/07/2021 Contraceptive used "on and off for past 15 years" HRT n/a  Hysterectomy? no BSO? no   SOCIAL HISTORY: (updated 04/2021)  Kathy Hickman is currently working for [employer], where she has worked for the last 20 years. She is single (divorced). She lives at home with her mother, who cleans houses part-time. Son Sheria Lang, age 38, works in Scientist, water quality in [place] with his father. Kathy Hickman is not a  church-attender.    ADVANCED DIRECTIVES: not in place   HEALTH MAINTENANCE: Social History   Tobacco Use   Smoking status: Never    Passive exposure: Current   Smokeless tobacco: Never  Vaping Use   Vaping status: Never Used  Substance Use Topics   Alcohol use: No   Drug use: No    Comment: narcotic dependence     Colonoscopy: 04/2009 (Dr. Jarold Motto)  PAP: 03/2021  Bone density: unsure   Allergies  Allergen Reactions   Amoxicillin Hives, Shortness Of Breath and Swelling   Sulfonamide Derivatives Hives, Shortness Of Breath and Swelling   Latex Rash     Current Outpatient Medications  Medication Sig Dispense Refill   ALPRAZolam (XANAX) 0.5 MG tablet Take 0.5 mg by mouth daily.     Ascorbic Acid (VITAMIN C) 1000 MG tablet Take 1,000 mg by mouth daily.     aspirin EC 81 MG tablet Take 81 mg by mouth daily. Swallow whole.     atenolol (TENORMIN) 50 MG tablet Take 25 mg by mouth daily.     cetirizine (ZYRTEC) 10 MG tablet Take 10 mg by mouth daily.     cetirizine-pseudoephedrine (ZYRTEC-D) 5-120 MG per tablet Take 1 tablet by mouth daily. allergies     Cholecalciferol (D3-1000 PO) Take by mouth.     clomiPRAMINE (ANAFRANIL) 25 MG capsule 25 mg daily.     Cod Liver Oil CAPS Take by mouth.     docusate sodium (COLACE) 100 MG capsule Take 1 capsule by mouth daily as needed.     doxylamine, Sleep, (UNISOM) 25 MG tablet Take 25 mg by mouth at bedtime as needed.     DULoxetine (CYMBALTA) 60 MG capsule Take 60 mg by mouth daily.     ferrous gluconate (FERGON) 240 (27 FE) MG tablet 1 tablet daily.     gabapentin (NEURONTIN) 300 MG capsule Take 2 capsules by mouth in the morning and at bedtime.     ibuprofen (ADVIL) 800 MG tablet Take 1 tablet (800 mg total) by mouth every 8 (eight) hours as needed. 30 tablet 0   levothyroxine (SYNTHROID) 25 MCG tablet Take 25 mcg by mouth daily before breakfast.     LYSINE PO Take by mouth.     montelukast (SINGULAIR) 10 MG tablet Take 10 mg by mouth daily.     morphine (KADIAN) 30 MG 24 hr capsule Take 1 capsule (30 mg total) by mouth every 12 (twelve) hours. (Patient not taking: Reported on 04/22/2023) 60 capsule 0   morphine (KADIAN) 30 MG 24 hr capsule Take 1 capsule (30 mg total) by mouth every 12 (twelve) hours. (Patient not taking: Reported on 04/22/2023) 60 capsule 0   morphine (KADIAN) 30 MG 24 hr capsule Take 1 capsule by mouth every 12 hours (Patient not taking: Reported on 04/22/2023) 60 capsule 0   morphine (MS CONTIN) 15 MG 12 hr tablet Take 1 tablet (15 mg total) by mouth every 8 (eight) hours. 90  tablet 0   morphine (MSIR) 15 MG tablet Take 15 mg by mouth in the morning, at noon, and at bedtime.     Multiple Vitamins-Minerals (MULTIVITAMINS THER. W/MINERALS) TABS Take 1 tablet by mouth daily.       naloxone (NARCAN) nasal spray 4 mg/0.1 mL Use 1 spray into 1 nostril as needed --may repeat dose every 2 - 3 minutes until patient responsive or EMS arrives 1 each 1   oxybutynin (DITROPAN) 5 MG tablet Take 0.5 tablets (2.5 mg total)  by mouth daily. (Patient not taking: Reported on 04/22/2023) 30 tablet 2   Oxycodone HCl 10 MG TABS Take 1 tablet by mouth 5 times a day as needed for pain 150 tablet 0   Oxycodone HCl 10 MG TABS Take 1 tablet (10 mg total) by mouth every 4 (four) hours as needed for pain 180 tablet 0   tamoxifen (NOLVADEX) 20 MG tablet TAKE ONE TABLET BY MOUTH DAILY 30 tablet 5   tiZANidine (ZANAFLEX) 4 MG capsule Take 4 mg by mouth in the morning and at bedtime.     topiramate (TOPAMAX) 25 MG tablet Take 2 tablets by mouth daily.     UNABLE TO FIND Colloidal Silver drops 250 ppm 2 drops daily     No current facility-administered medications for this visit.    Left  Vitals:   09/05/23 0945  BP: (!) 150/68  Pulse: 95  Resp: 16  Temp: (!) 97.5 F (36.4 C)  SpO2: 99%      Body mass index is 40.46 kg/m.   Wt Readings from Last 3 Encounters:  09/05/23 235 lb 11.2 oz (106.9 kg)  05/07/23 260 lb (117.9 kg)  05/02/23 257 lb (116.6 kg)      ECOG FS:1 - Symptomatic but completely ambulatory  Physical Exam Constitutional:      Appearance: Normal appearance.  Cardiovascular:     Rate and Rhythm: Normal rate and regular rhythm.     Pulses: Normal pulses.     Heart sounds: Normal heart sounds.  Chest:     Comments: Bilateral breasts inspected.  Scar tissue noted in the right breast at the previous site of lumpectomy.  No other palpable masses.  No regional adenopathy.  Left breast normal to inspection and palpation. Musculoskeletal:     Cervical back: Normal range of  motion and neck supple. No rigidity.  Lymphadenopathy:     Cervical: No cervical adenopathy.  Neurological:     Mental Status: She is alert.      LAB RESULTS:  CMP     Component Value Date/Time   NA 136 05/02/2023 1009   K 3.9 05/02/2023 1009   CL 106 05/02/2023 1009   CO2 23 05/02/2023 1009   GLUCOSE 117 (H) 05/02/2023 1009   BUN 16 05/02/2023 1009   CREATININE 0.79 05/02/2023 1009   CREATININE 0.95 04/25/2021 0828   CALCIUM 8.5 (L) 05/02/2023 1009   PROT 7.2 04/25/2021 0828   ALBUMIN 4.3 04/25/2021 0828   AST 17 04/25/2021 0828   ALT 19 04/25/2021 0828   ALKPHOS 70 04/25/2021 0828   BILITOT 0.3 04/25/2021 0828   GFRNONAA >60 05/02/2023 1009   GFRNONAA >60 04/25/2021 0828   GFRAA >90 07/15/2011 0436    No results found for: "TOTALPROTELP", "ALBUMINELP", "A1GS", "A2GS", "BETS", "BETA2SER", "GAMS", "MSPIKE", "SPEI"  Lab Results  Component Value Date   WBC 4.8 05/02/2023   NEUTROABS 3.5 04/25/2021   HGB 12.5 05/02/2023   HCT 39.3 05/02/2023   MCV 88.5 05/02/2023   PLT 251 05/02/2023    No results found for: "LABCA2"  No components found for: "WUJWJX914"  No results for input(s): "INR" in the last 168 hours.  No results found for: "LABCA2"  No results found for: "NWG956"  No results found for: "CAN125"  No results found for: "CAN153"  No results found for: "CA2729"  No components found for: "HGQUANT"  No results found for: "CEA1", "CEA" / No results found for: "CEA1", "CEA"   No results found for: "AFPTUMOR"  No results found for: "CHROMOGRNA"  No results found for: "KPAFRELGTCHN", "LAMBDASER", "KAPLAMBRATIO" (kappa/lambda light chains)  No results found for: "HGBA", "HGBA2QUANT", "HGBFQUANT", "HGBSQUAN" (Hemoglobinopathy evaluation)   No results found for: "LDH"  Lab Results  Component Value Date   IRON <10 (L) 07/14/2011   TIBC Not calculated due to Iron <10. 07/14/2011   IRONPCTSAT Not calculated due to Iron <10. 07/14/2011   (Iron  and TIBC)  Lab Results  Component Value Date   FERRITIN 116 07/14/2011    Urinalysis    Component Value Date/Time   COLORURINE YELLOW 03/17/2011 1517   APPEARANCEUR CLEAR 03/17/2011 1517   LABSPEC 1.009 03/17/2011 1517   PHURINE 6.5 03/17/2011 1517   GLUCOSEU NEGATIVE 03/17/2011 1517   HGBUR NEGATIVE 03/17/2011 1517   BILIRUBINUR NEGATIVE 03/17/2011 1517   KETONESUR NEGATIVE 03/17/2011 1517   PROTEINUR NEGATIVE 03/17/2011 1517   UROBILINOGEN 0.2 03/17/2011 1517   NITRITE NEGATIVE 03/17/2011 1517   LEUKOCYTESUR NEGATIVE 03/17/2011 1517    STUDIES: No results found.  ELIGIBLE FOR AVAILABLE RESEARCH PROTOCOL: no  ASSESSMENT: 43 y.o. [place] woman status post a right breast biopsy on 04/17/2021 for ductal carcinoma in situ, grade 2, with possible microinvasion; estrogen and progesterone receptor positive. She underwent definitive surgery, right breast lumpectomy and no evidence of residual DCIS on final pathology.  She completed adjuvant radiation on August 17, 2021.  She tolerated it well.  She couldn't tolerate full dose of tamoxifen 20 mg, so hence switched to 10 mg.  Postmenopausal Symptoms Patient reports mood swings and hot flashes following total hysterectomy. Discussed the potential benefits of adjusting current medication regimen. -Discontinue Tamoxifen. -Start Anastrozole, one pill daily per pt preference. -Contact pain management doctor, Dr. Manon Hilding, to discuss potential increase in Cymbalta dosage which may help mood swings and hot flashes.  Breast Cancer Follow-up Patient reports no issues with recent mammogram.  Physical examination of the breast revealed no abnormalities. Continue regular mammogram screenings. Plan to follow up in a couple of months to assess response to new medication, Anastrozole.  Bone Density Discussed potential impact of Anastrozole on bone density, given family history of bone density issues. -Plan to monitor bone density with scans  every two years. -Encourage patient to stay active to maintain bone health. Total time: 30 min *Total Encounter Time as defined by the Centers for Medicare and Medicaid Services includes, in addition to the face-to-face time of a patient visit (documented in the note above) non-face-to-face time: obtaining and reviewing outside history, ordering and reviewing medications, tests or procedures, care coordination (communications with other health care professionals or caregivers) and documentation in the medical record.

## 2023-09-19 ENCOUNTER — Encounter: Payer: Self-pay | Admitting: Hematology and Oncology

## 2023-09-19 MED ORDER — ANASTROZOLE 1 MG PO TABS: 1.0000 mg | ORAL_TABLET | Freq: Every day | ORAL | 3 refills | Status: DC

## 2023-09-23 ENCOUNTER — Other Ambulatory Visit (HOSPITAL_COMMUNITY): Payer: Self-pay

## 2023-09-23 MED ORDER — BUPRENORPHINE 5 MCG/HR TD PTWK
MEDICATED_PATCH | TRANSDERMAL | 0 refills | Status: AC
Start: 2023-09-23 — End: ?
  Filled 2023-09-23: qty 4, 28d supply, fill #0

## 2023-09-23 MED ORDER — MORPHINE SULFATE ER 15 MG PO TBCR
15.0000 mg | EXTENDED_RELEASE_TABLET | Freq: Three times a day (TID) | ORAL | 0 refills | Status: DC
  Filled 2023-09-23: qty 90, 30d supply, fill #0

## 2023-09-23 MED ORDER — OXYCODONE HCL 10 MG PO TABS
10.0000 mg | ORAL_TABLET | ORAL | 0 refills | Status: AC
  Filled 2023-09-23: qty 180, 30d supply, fill #0

## 2023-09-29 ENCOUNTER — Other Ambulatory Visit (HOSPITAL_COMMUNITY): Payer: Self-pay

## 2023-09-29 MED ORDER — TRAMADOL HCL ER 100 MG PO TB24
100.0000 mg | ORAL_TABLET | Freq: Every day | ORAL | 0 refills | Status: AC
  Filled 2023-09-29: qty 30, 30d supply, fill #0

## 2023-09-30 ENCOUNTER — Other Ambulatory Visit (HOSPITAL_COMMUNITY): Payer: Self-pay

## 2023-10-01 ENCOUNTER — Other Ambulatory Visit (HOSPITAL_COMMUNITY): Payer: Self-pay

## 2023-10-22 ENCOUNTER — Other Ambulatory Visit (HOSPITAL_COMMUNITY): Payer: Self-pay

## 2023-10-22 MED ORDER — OXYCODONE HCL 10 MG PO TABS: 10.0000 mg | ORAL_TABLET | ORAL | 0 refills | Status: DC | PRN

## 2023-12-11 ENCOUNTER — Other Ambulatory Visit: Payer: Self-pay | Admitting: Hematology and Oncology

## 2024-04-14 ENCOUNTER — Telehealth: Payer: Self-pay

## 2024-04-14 ENCOUNTER — Other Ambulatory Visit: Payer: Self-pay | Admitting: Hematology and Oncology

## 2024-04-14 NOTE — Telephone Encounter (Signed)
 Called pt to discuss anastrozole /if she is tolerating as she was never scheduled for f/u per MD.   We can not send anastrozole  refill until she has an appt scheduled for f/u.  LVM for call back.

## 2024-06-11 ENCOUNTER — Encounter (HOSPITAL_BASED_OUTPATIENT_CLINIC_OR_DEPARTMENT_OTHER): Payer: Self-pay | Admitting: Internal Medicine

## 2024-07-26 ENCOUNTER — Encounter (HOSPITAL_BASED_OUTPATIENT_CLINIC_OR_DEPARTMENT_OTHER): Payer: Self-pay | Admitting: Internal Medicine

## 2024-07-26 DIAGNOSIS — G2581 Restless legs syndrome: Secondary | ICD-10-CM

## 2024-07-26 DIAGNOSIS — R5383 Other fatigue: Secondary | ICD-10-CM

## 2024-07-26 DIAGNOSIS — G4710 Hypersomnia, unspecified: Secondary | ICD-10-CM
